# Patient Record
Sex: Male | Born: 1960 | Race: White | Hispanic: No | Smoking: Current every day smoker
Health system: Southern US, Community
[De-identification: ages and names within clinical notes are randomized; demographics above are authoritative.]

## PROBLEM LIST (undated history)

## (undated) DIAGNOSIS — M1990 Unspecified osteoarthritis, unspecified site: Secondary | ICD-10-CM

## (undated) DIAGNOSIS — G8929 Other chronic pain: Secondary | ICD-10-CM

## (undated) DIAGNOSIS — J449 Chronic obstructive pulmonary disease, unspecified: Secondary | ICD-10-CM

## (undated) DIAGNOSIS — B1920 Unspecified viral hepatitis C without hepatic coma: Secondary | ICD-10-CM

## (undated) HISTORY — DX: Chronic obstructive pulmonary disease, unspecified: J44.9

## (undated) HISTORY — DX: Unspecified viral hepatitis C without hepatic coma: B19.20

## (undated) HISTORY — DX: Other chronic pain: G89.29

## (undated) HISTORY — DX: Unspecified osteoarthritis, unspecified site: M19.90

---

## 2010-06-29 ENCOUNTER — Emergency Department (HOSPITAL_COMMUNITY): Payer: No Typology Code available for payment source

## 2010-06-29 ENCOUNTER — Emergency Department (HOSPITAL_COMMUNITY)
Admission: EM | Admit: 2010-06-29 | Discharge: 2010-06-29 | Disposition: A | Discharge status: Home / Self Care | Payer: No Typology Code available for payment source | Attending: Emergency Medicine | Admitting: Emergency Medicine

## 2010-06-29 DIAGNOSIS — S2249XA Multiple fractures of ribs, unspecified side, initial encounter for closed fracture: Secondary | ICD-10-CM | POA: Insufficient documentation

## 2011-03-30 ENCOUNTER — Emergency Department (HOSPITAL_COMMUNITY): Payer: Self-pay

## 2011-03-30 ENCOUNTER — Other Ambulatory Visit: Payer: Self-pay

## 2011-03-30 ENCOUNTER — Emergency Department (HOSPITAL_COMMUNITY)
Admission: EM | Admit: 2011-03-30 | Discharge: 2011-03-31 | Disposition: A | Discharge status: Home / Self Care | Payer: Self-pay | Attending: Emergency Medicine | Admitting: Emergency Medicine

## 2011-03-30 DIAGNOSIS — M549 Dorsalgia, unspecified: Secondary | ICD-10-CM | POA: Insufficient documentation

## 2011-03-30 DIAGNOSIS — F10929 Alcohol use, unspecified with intoxication, unspecified: Secondary | ICD-10-CM

## 2011-03-30 DIAGNOSIS — F17200 Nicotine dependence, unspecified, uncomplicated: Secondary | ICD-10-CM | POA: Insufficient documentation

## 2011-03-30 DIAGNOSIS — F1010 Alcohol abuse, uncomplicated: Secondary | ICD-10-CM | POA: Insufficient documentation

## 2011-03-30 DIAGNOSIS — R079 Chest pain, unspecified: Secondary | ICD-10-CM | POA: Insufficient documentation

## 2011-03-30 DIAGNOSIS — W19XXXA Unspecified fall, initial encounter: Secondary | ICD-10-CM | POA: Insufficient documentation

## 2011-03-30 DIAGNOSIS — R209 Unspecified disturbances of skin sensation: Secondary | ICD-10-CM | POA: Insufficient documentation

## 2011-03-30 LAB — COMPREHENSIVE METABOLIC PANEL
ALT: 65 U/L — ABNORMAL HIGH (ref 0–53)
AST: 90 U/L — ABNORMAL HIGH (ref 0–37)
Albumin: 4 g/dL (ref 3.5–5.2)
Alkaline Phosphatase: 47 U/L (ref 39–117)
GFR calc Af Amer: >90 mL/min (ref >90)
Glucose, Bld: 83 mg/dL (ref 70–99)
Potassium: 5 mEq/L (ref 3.5–5.1)
Sodium: 140 mEq/L (ref 135–145)
Total Protein: 7.7 g/dL (ref 6.0–8.3)

## 2011-03-30 LAB — URINALYSIS, ROUTINE W REFLEX MICROSCOPIC
Nitrite: NEGATIVE
Specific Gravity, Urine: <1.005 — ABNORMAL LOW (ref 1.005–1.030)
Urobilinogen, UA: 0.2 mg/dL (ref 0.0–1.0)

## 2011-03-30 LAB — DIFFERENTIAL
Eosinophils Absolute: 0.2 10*3/uL (ref 0.0–0.7)
Lymphs Abs: 3.9 10*3/uL (ref 0.7–4.0)
Neutrophils Relative %: 29 % — ABNORMAL LOW (ref 43–77)

## 2011-03-30 LAB — PROTIME-INR
INR: 0.95 (ref 0.00–1.49)
Prothrombin Time: 12.9 seconds (ref 11.6–15.2)

## 2011-03-30 LAB — CBC
MCH: 32.9 pg (ref 26.0–34.0)
Platelets: 151 10*3/uL (ref 150–400)
RBC: 4.53 MIL/uL (ref 4.22–5.81)
WBC: 6.3 10*3/uL (ref 4.0–10.5)

## 2011-03-30 MED ORDER — SODIUM CHLORIDE 0.9 % IV SOLN: INTRAVENOUS | Status: DC

## 2011-03-30 NOTE — ED Provider Notes (Signed)
Scribed for Ward Givens, MD, the patient was seen in room APA08/APA08 . This chart was scribed by Ellie Lunch.   CSN: [id number] Arrival date & time: 03/30/2011  9:31 PM   First MD Initiated Contact with Patient 03/30/11 2222      Chief Complaint  Patient presents with  . Chest Pain  . Back Pain    (Consider location/radiation/quality/duration/timing/severity/associated sxs/prior treatment) HPI Pt seen at 10:25 PM  John Wells is a 50 y.o. male who presents to the Emergency Department complaining of chest pain and weakness. Pt was working on a roof in [place] 2 days ago and fell. Denies LOC or head injury. Reports some left chest wall tenderness after fall.  Pain is described as a sharp pain and currently rated 5/10. Pain is worse with deep breath. Pt treated pain with alcohol with little improvement. Pt says he was able to work yesterday. Pt reports numbness in his left shoulder, arm, part of his face, and part of his left leg since yesterday.  Per friend Pt returned from [place] last night. Pt was at baseline yesterday. Pt also fell again tonight ~ 2.5 hours ago. Pt fell on his left side. Friend observed speech disturbance after fall when he picked up PT to bring him to the ED. PT admits to drinking alcohol today.   Primary care physician none  History reviewed. No pertinent past medical history. History of cervical fracture at C6-7, also fracture of his back  History reviewed. No pertinent past surgical history.  No family history on file. No family history of stroke.   History  Substance Use Topics  . Smoking status: Current Everyday Smoker -- 2.0 packs/day  . Smokeless tobacco: Not on file  . Alcohol Use: Yes   patient employed   Review of Systems  Cardiovascular: Positive for chest pain (chest wall tenderness).  Neurological: Positive for numbness. Negative for headaches.  All other systems reviewed and are negative.    Allergies  Review of patient's  allergies indicates no known allergies.  Home Medications  No current outpatient prescriptions on file.  BP 118/84  Pulse 78  Temp(Src) 98.4 F (36.9 C) (Oral)  Resp 18  Ht 6' (1.829 m)  Wt 150 lb (68.04 kg)  BMI 20.34 kg/m2  SpO2 94%  Vital signs normal   Physical Exam  Vitals reviewed. Constitutional: He is oriented to person, place, and time.       Patient is is underweight.. patient has very difficult speech to understand he is also noted to be edentulous. Patient smells of alcohol  HENT:  Head: Normocephalic and atraumatic.  Eyes: Conjunctivae and EOM are normal. Pupils are equal, round, and reactive to light.  Neck: Normal range of motion. Neck supple.  Cardiovascular: Normal rate, regular rhythm and normal heart sounds.   Pulmonary/Chest: Effort normal and breath sounds normal. He has no wheezes. He has no rales. He exhibits no tenderness.  Abdominal: Soft. Bowel sounds are normal.  Musculoskeletal: Normal range of motion.       Patient seems to have a lot of pain centered in his left shoulder he has pain on attempted range of motion. There is no obvious deformity or swelling or bruising seen.  Neurological: He is alert and oriented to person, place, and time. No cranial nerve deficit.       Patient has no cranial nerve deficit. He's noted to have decreased range of motion of his left upper arm and left lower leg that is mainly  because of pain when he tries to move it. He seems to center a lot of his pain in his left shoulder. Although there is no swelling or deformity seen. He is uncoordinated with finger to nose on the right and has difficulty with finger to nose on the left because of pain when he moves his left arm. His grips are good with slight decrease on the left. He has good range of motion of his arm at the elbow he has some added adduction of his shoulder but he states it hurts in his shoulder  Skin: Skin is warm and dry.    ED Course  Procedures (including  critical care time) OTHER DATA REVIEWED: Nursing notes, vital signs, and past medical records reviewed.   DIAGNOSTIC STUDIES: Oxygen Saturation is 94% on room air, adequate by my interpretation.     Results for orders placed during the hospital encounter of 03/30/11  CBC      Component Value Range   WBC 6.3  4.0 - 10.5 (K/uL)   RBC 4.53  4.22 - 5.81 (MIL/uL)   Hemoglobin 14.9  13.0 - 17.0 (g/dL)   HCT 16.1  09.6 - 04.5 (%)   MCV 100.0  78.0 - 100.0 (fL)   MCH 32.9  26.0 - 34.0 (pg)   MCHC 32.9  30.0 - 36.0 (g/dL)   RDW 40.9  81.1 - 91.4 (%)   Platelets 151  150 - 400 (K/uL)  DIFFERENTIAL      Component Value Range   Neutrophils Relative 29 (*) 43 - 77 (%)   Neutro Abs 1.8  1.7 - 7.7 (K/uL)   Lymphocytes Relative 62 (*) 12 - 46 (%)   Lymphs Abs 3.9  0.7 - 4.0 (K/uL)   Monocytes Relative 6  3 - 12 (%)   Monocytes Absolute 0.4  0.1 - 1.0 (K/uL)   Eosinophils Relative 3  0 - 5 (%)   Eosinophils Absolute 0.2  0.0 - 0.7 (K/uL)   Basophils Relative 1  0 - 1 (%)   Basophils Absolute 0.0  0.0 - 0.1 (K/uL)  COMPREHENSIVE METABOLIC PANEL      Component Value Range   Sodium 140  135 - 145 (mEq/L)   Potassium 5.0  3.5 - 5.1 (mEq/L)   Chloride 106  96 - 112 (mEq/L)   CO2 25  19 - 32 (mEq/L)   Glucose, Bld 83  70 - 99 (mg/dL)   BUN 4 (*) 6 - 23 (mg/dL)   Creatinine, Ser 7.82  0.50 - 1.35 (mg/dL)   Calcium 9.0  8.4 - 95.6 (mg/dL)   Total Protein 7.7  6.0 - 8.3 (g/dL)   Albumin 4.0  3.5 - 5.2 (g/dL)   AST 90 (*) 0 - 37 (U/L)   ALT 65 (*) 0 - 53 (U/L)   Alkaline Phosphatase 47  39 - 117 (U/L)   Total Bilirubin 0.3  0.3 - 1.2 (mg/dL)   GFR calc non Af Amer >90  >90 (mL/min)   GFR calc Af Amer >90  >90 (mL/min)  ETHANOL      Component Value Range   Alcohol, Ethyl (B) 318 (*) 0 - 11 (mg/dL)  TROPONIN I      Component Value Range   Troponin I <0.30  <0.30 (ng/mL)  APTT      Component Value Range   aPTT 26  24 - 37 (seconds)  PROTIME-INR      Component Value Range   Prothrombin  Time 12.9  11.6 -  15.2 (seconds)   INR 0.95  0.00 - 1.49   URINALYSIS, ROUTINE W REFLEX MICROSCOPIC      Component Value Range   Color, Urine YELLOW  YELLOW    Appearance CLEAR  CLEAR    Specific Gravity, Urine <1.005 (*) 1.005 - 1.030    pH 5.0  5.0 - 8.0    Glucose, UA NEGATIVE  NEGATIVE (mg/dL)   Hgb urine dipstick NEGATIVE  NEGATIVE    Bilirubin Urine NEGATIVE  NEGATIVE    Ketones, ur NEGATIVE  NEGATIVE (mg/dL)   Protein, ur NEGATIVE  NEGATIVE (mg/dL)   Urobilinogen, UA 0.2  0.0 - 1.0 (mg/dL)   Nitrite NEGATIVE  NEGATIVE    Leukocytes, UA NEGATIVE  NEGATIVE    Laboratory interpretation acute abnormalities except for alcohol intoxication  Dg Chest 2 View  03/30/2011  *RADIOLOGY REPORT*  Clinical Data: Left-sided chest pain, back pain.  Recent fall.  CHEST - 2 VIEW  Comparison: 06/29/2010  Findings: Hyperinflation, in keeping with changes of COPD. Cardiomediastinal contours within normal limits.  Multiple subacute and chronic posterior and posterolateral left rib fractures  IMPRESSION: Hyperinflation without focal consolidation.  Subacute and chronic appearing posterior and posterolateral left rib fractures.  Original Report Authenticated By: Waneta Martins, M.D.   Ct Head Wo Contrast  03/30/2011  *RADIOLOGY REPORT*  Clinical Data: Altered mental status, left-sided weakness, recent fall.  CT HEAD WITHOUT CONTRAST  Technique:  Contiguous axial images were obtained from the base of the skull through the vertex without contrast.  Comparison: None.  Findings: Hypodensity within the right cerebellum (image 11 of series 2). There is no evidence for acute hemorrhage, hydrocephalus, mass lesion, or abnormal extra-axial fluid collection. Otherwise, no CT evidence for acute infarction.  The visualized paranasal sinuses and mastoid air cells are predominately clear.  No displaced calvarial fracture identified. Curvilinear calcific density anterior to the nasal bones, may reflect remote trauma.   IMPRESSION: Hypodensity within the right cerebellum may reflect a prominent sulcus however an age indeterminate lacunar infarction is not excluded. If clinical concern for acute ischemia persists, recommend MRI with diffusion weighted imaging.  Curvilinear calcific density anterior to the nasal bones, may reflect remote trauma.  Correlate with direct inspection if concerned for acute injury.  Original Report Authenticated By: Waneta Martins, M.D.    Date: 03/30/2011  Rate: 80  Rhythm: normal sinus rhythm  QRS Axis: normal  Intervals: normal  ST/T Wave abnormalities: normal  Conduction Disutrbances:none  Narrative Interpretation: IBBB, Biatrial enlargment  Old EKG Reviewed: none available  Course patient had IV inserted he was given IV fluids. His plan will be to observe until he sobers up and then repeat his neurological exam. It is not clear at this time whether his symptoms are related to his fall with contusions or she has an acute neurological deficit. His friend states he was with him at 4:30 and patient seemed fine however he been drinking afterwards and went to a friend's house and tripped on a root in the yard and fell onto his left side again. Because of history of fall patient has CT of chest and abdomen ordered to make sure he does not have an occult injury there from his fall.  Diagnoses that have been ruled out:  Diagnoses that are still under consideration:  Final diagnoses:  Fall  Alcohol intoxication    Plan is to observe patient in the ER until he sobers up and reexamine patient MDM    I personally performed the  services described in this documentation, which was scribed in my presence. The recorded information has been reviewed and considered. Devoria Albe, MD, Arm[facility] Gang          Ward Givens, MD 03/31/11 [id number]

## 2011-03-30 NOTE — ED Notes (Signed)
Pt presents with left sided chest pain, left arm numbness, and back pain. Pt states he fell off a roof 2 days ago.

## 2011-03-30 NOTE — ED Notes (Signed)
Pt reports he fell 2 days ago off of a roof, pt reports he fell onto the grass on his left side, today pt reports he began to have some chest pain and generalized weakness, pt reports he fell again tonight landing again on left side, grip strength weaker on left, Dr. Lynelle Doctor notified

## 2011-03-30 NOTE — ED Notes (Signed)
Pt speech is slurred and pt is being difficult in regards to answering questions. Pt admits to drinking " a couple of forties".

## 2011-03-31 ENCOUNTER — Emergency Department (HOSPITAL_COMMUNITY): Payer: Self-pay

## 2011-03-31 MED ORDER — IOHEXOL 300 MG/ML  SOLN
100.0000 mL | Freq: Once | INTRAMUSCULAR | Status: AC | PRN
  Administered 2011-03-31: 100 mL via INTRAVENOUS

## 2011-03-31 NOTE — Progress Notes (Signed)
0100 Assumed care/disposition of patient. Patient fell of of a roof several days ago and c/o left sided pain. He has been drinking heavily and ETOH is 318. Awaiting CT scan results. Patient to remain in the ER overnight. Currently sleeping. 0300 CTs negative for acute injury. Patient remains stable. Awakens easily. VSS. No c/o Dg Chest 2 View  03/30/2011  *RADIOLOGY REPORT*  Clinical Data: Left-sided chest pain, back pain.  Recent fall.  CHEST - 2 VIEW  Comparison: 06/29/2010  Findings: Hyperinflation, in keeping with changes of COPD. Cardiomediastinal contours within normal limits.  Multiple subacute and chronic posterior and posterolateral left rib fractures  IMPRESSION: Hyperinflation without focal consolidation.  Subacute and chronic appearing posterior and posterolateral left rib fractures.  Original Report Authenticated By: Waneta Martins, M.D.   Ct Head Wo Contrast  03/30/2011  *RADIOLOGY REPORT*  Clinical Data: Altered mental status, left-sided weakness, recent fall.  CT HEAD WITHOUT CONTRAST  Technique:  Contiguous axial images were obtained from the base of the skull through the vertex without contrast.  Comparison: None.  Findings: Hypodensity within the right cerebellum (image 11 of series 2). There is no evidence for acute hemorrhage, hydrocephalus, mass lesion, or abnormal extra-axial fluid collection. Otherwise, no CT evidence for acute infarction.  The visualized paranasal sinuses and mastoid air cells are predominately clear.  No displaced calvarial fracture identified. Curvilinear calcific density anterior to the nasal bones, may reflect remote trauma.  IMPRESSION: Hypodensity within the right cerebellum may reflect a prominent sulcus however an age indeterminate lacunar infarction is not excluded. If clinical concern for acute ischemia persists, recommend MRI with diffusion weighted imaging.  Curvilinear calcific density anterior to the nasal bones, may reflect remote trauma.   Correlate with direct inspection if concerned for acute injury.  Original Report Authenticated By: Waneta Martins, M.D.   Ct Chest W Contrast  03/31/2011  *RADIOLOGY REPORT*  Clinical Data:  Fall off roof.  Left-sided chest, abdominal, and flank pain.  CT CHEST, ABDOMEN AND PELVIS WITH CONTRAST  Technique:  Multidetector CT imaging of the chest, abdomen and pelvis was performed following the standard protocol during bolus administration of intravenous contrast.  Contrast: OMNIPAQUE IOHEXOL 300 MG/ML IV SOLN  Comparison:  None  CT CHEST  Findings:  No evidence of thoracic aortic injury or mediastinal hematoma.  No mediastinal or hilar masses are identified.  No adenopathy seen elsewhere within the thorax.  There is no evidence of hemothorax or pneumothorax.  Both lungs are clear.  No evidence of mass or infiltrate.  No fractures are identified.  IMPRESSION:  Negative.  No evidence of thoracic aortic injury or other significant abnormality.  CT ABDOMEN AND PELVIS  Findings:  No evidence of lacerations or contusions involving the abdominal parenchymal organs.  No evidence of hemoperitoneum.  No soft tissue masses or lymphadenopathy identified.  No evidence of inflammatory process or abnormal fluid collections.  Unopacified bowel has a normal appearance.  No evidence of acute fracture.  Old compression fracture of the L2 vertebral body is noted.  IMPRESSION:  1.  No evidence of visceral injury, or hemoperitoneum, or other acute findings. 2.  Old L2 vertebral body compression fracture.  Original Report Authenticated By: Danae Orleans, M.D.   Ct Cervical Spine Wo Contrast  03/31/2011  *RADIOLOGY REPORT*  Clinical Data: Left-sided neck stiffness  CT CERVICAL SPINE WITHOUT CONTRAST  Technique:  Multidetector CT imaging of the cervical spine was performed. Multiplanar CT image reconstructions were also generated.  Comparison: 03/30/2011  head CT, 03/31/2011 chest CT  Findings: Limited images through the  lower brain show no acute abnormality.  Limited images through the lung apices demonstrate centrolobular emphysematous changes. No prevertebral or paravertebral soft tissue swelling.  The craniocervical relationship is maintained.  Multilevel degenerative changes with facet arthropathy present.  Degenerative disc disease is most pronounced at C6-7.  Disc osteophyte complex and uncovertebral hypertrophy at this level results in mild central canal narrowing and moderate right neural foraminal narrowing.  IMPRESSION: Multilevel degenerative changes.  No acute fracture or dislocation identified.  Original Report Authenticated By: Waneta Martins, M.D.   Ct Abdomen Pelvis W Contrast  03/31/2011  *RADIOLOGY REPORT*  Clinical Data:  Fall off roof.  Left-sided chest, abdominal, and flank pain.  CT CHEST, ABDOMEN AND PELVIS WITH CONTRAST  Technique:  Multidetector CT imaging of the chest, abdomen and pelvis was performed following the standard protocol during bolus administration of intravenous contrast.  Contrast: OMNIPAQUE IOHEXOL 300 MG/ML IV SOLN  Comparison:  None  CT CHEST  Findings:  No evidence of thoracic aortic injury or mediastinal hematoma.  No mediastinal or hilar masses are identified.  No adenopathy seen elsewhere within the thorax.  There is no evidence of hemothorax or pneumothorax.  Both lungs are clear.  No evidence of mass or infiltrate.  No fractures are identified.  IMPRESSION:  Negative.  No evidence of thoracic aortic injury or other significant abnormality.  CT ABDOMEN AND PELVIS  Findings:  No evidence of lacerations or contusions involving the abdominal parenchymal organs.  No evidence of hemoperitoneum.  No soft tissue masses or lymphadenopathy identified.  No evidence of inflammatory process or abnormal fluid collections.  Unopacified bowel has a normal appearance.  No evidence of acute fracture.  Old compression fracture of the L2 vertebral body is noted.  IMPRESSION:  1.  No  evidence of visceral injury, or hemoperitoneum, or other acute findings. 2.  Old L2 vertebral body compression fracture.  Original Report Authenticated By: Danae Orleans, M.D.   Dg Shoulder Left  03/31/2011  *RADIOLOGY REPORT*  Clinical Data: Left shoulder pain status post fall.  LEFT SHOULDER - 2+ VIEW  Comparison: None.  Findings: No fracture or dislocation of the left shoulder. Evidence of prior posterior left rib fractures.  IMPRESSION: No fracture or dislocation of the left shoulder.  Original Report Authenticated By: Waneta Martins, M.D.  [phone] Patient awake, oriented, asking to be discharge. Up to the bathroom without assistance. No voiced c/o. Nursing to call ride.

## 2011-03-31 NOTE — ED Notes (Signed)
Pt resting quietly.

## 2011-03-31 NOTE — ED Notes (Signed)
Pt's family member enroute to pick up pt

## 2012-05-31 DIAGNOSIS — R0602 Shortness of breath: Secondary | ICD-10-CM

## 2014-05-14 HISTORY — PX: OTHER SURGICAL HISTORY: SHX169
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
## 2016-08-22 NOTE — Congregational Nurse Program (Unsigned)
Congregational Nurse Program Note  Date of Encounter: 08/22/2016   Past Medical History: No past medical history on file.  Encounter Details: complained of pain  In  t leg
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
## 2016-11-12 NOTE — Congregational Nurse Program (Signed)
Congregational Nurse Program Note  Date of Encounter: 11/12/2016  Past Medical History: No past medical history on file.  Encounter Details:     CNP Questionnaire - 11/12/16 1049      Patient Demographics   Is this a new or existing patient? New   Patient is considered a/an Not Applicable   Race Caucasian/White     Patient Assistance   Location of Patient [place]   Patient's financial/insurance status Self-Pay (Uninsured)   Uninsured Patient ([place] Card/Care Connects) No   Patient referred to apply for the following financial assistance [employer] insecurities addressed Provided food supplies   Transportation assistance No   Assistance securing medications No   Doctor, hospital the healthcare system;Chronic disease     Encounter Details   Primary purpose of visit [place]   Was an Emergency Department visit averted? No   Does patient have a medical provider? No   Patient referred to Clinic   Was a mental health screening completed? (GAINS tool) No   Does patient have dental issues? No   Does patient have vision issues? No   Does your patient have an abnormal blood pressure today? No   Since previous encounter, have you referred patient for abnormal blood pressure that resulted in a new diagnosis or medication change? No   Does your patient have an abnormal blood glucose today? No   Since previous encounter, have you referred patient for abnormal blood glucose that resulted in a new diagnosis or medication change? No   Was there a life-saving intervention made? No    Cient was assisted with applying for CareConnects through [employer] to establish a  PCP at [facility] in [place]. An appointment is scheduled for 12/03/16.  Tarri Fuller  [phone].

## 2016-12-18 ENCOUNTER — Encounter: Payer: Self-pay | Admitting: Gastroenterology

## 2017-02-11 ENCOUNTER — Encounter: Payer: Self-pay | Admitting: Gastroenterology

## 2017-02-11 ENCOUNTER — Ambulatory Visit (INDEPENDENT_AMBULATORY_CARE_PROVIDER_SITE_OTHER): Payer: Self-pay | Admitting: Gastroenterology

## 2017-02-11 DIAGNOSIS — B182 Chronic viral hepatitis C: Secondary | ICD-10-CM
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
## 2017-02-11 NOTE — Patient Instructions (Signed)
1. Please have your labs and ultrasound done as scheduled.  2. You need to cut back on alcohol consumption so we can move towards hepatitis C treatment in the near future. You are also high risk for cirrhosis with chronic alcohol use AND hepatitis C.

## 2017-02-11 NOTE — Assessment & Plan Note (Signed)
56 year old gentleman history of chronic hepatitis C. Patient denies eating told of hepatitis C status during multiple incarcerations, has been out of jail since 2015. Risk factors including remote IV drug use. Did not discuss tattoo status. At any rate confirmed hep C. Obtain labs for fibrosis, ultrasound with elastography. Discussed at length with patient, he needs to cut way back on his alcohol use prior to considering hepatitis C treatment. He voiced understanding. It is important to get his hep C treated to support better outcome with regards to liver function. Discussed increased risk of cirrhosis in the setting of concomitant hep C and alcohol use.  Patient is homeless. Lives in a tent at times but currently living in a friend's house. He does have means for food and is able to keep the cell phone on. Discussed importance of being able to maintain contact with the patient especially if we proceed with hep C treatment. He voiced understanding.

## 2017-02-14 NOTE — Progress Notes (Signed)
CC'ED TO PCP 

## 2017-02-15 ENCOUNTER — Other Ambulatory Visit (HOSPITAL_COMMUNITY)
Admission: RE | Admit: 2017-02-15 | Discharge: 2017-02-15 | Disposition: A | Discharge status: Home / Self Care | Payer: Medicaid Other | Source: Ambulatory Visit | Attending: Gastroenterology | Admitting: Gastroenterology

## 2017-02-15 ENCOUNTER — Ambulatory Visit (HOSPITAL_COMMUNITY)
Admission: RE | Admit: 2017-02-15 | Discharge: 2017-02-15 | Disposition: A | Discharge status: Home / Self Care | Payer: Medicaid Other | Source: Ambulatory Visit | Attending: Gastroenterology | Admitting: Gastroenterology

## 2017-02-15 DIAGNOSIS — B182 Chronic viral hepatitis C: Secondary | ICD-10-CM | POA: Insufficient documentation

## 2017-02-15 LAB — PROTIME-INR
INR: 0.96
PROTHROMBIN TIME: 12.7 s (ref 11.4–15.2)

## 2017-02-16 LAB — HCV RNA QUANT RFLX ULTRA OR GENOTYP
HCV RNA Qnt(log copy/mL): 1.301 log10 IU/mL
HEPATITIS C QUANTITATION: 20 [IU]/mL

## 2017-02-16 LAB — HIV ANTIBODY (ROUTINE TESTING W REFLEX): HIV Screen 4th Generation wRfx: NONREACTIVE

## 2017-02-16 LAB — HEPATITIS B CORE ANTIBODY, TOTAL: HEP B C TOTAL AB: NEGATIVE

## 2017-02-17 NOTE — Progress Notes (Signed)
HCV RNA count is very low but detectable, only 20 IU/mL. Too low to get a genotype. No HIV or Hep B.  Await u/s and fibrosure test.

## 2017-02-17 NOTE — Progress Notes (Signed)
U/s showed F2 with some F3, moderate risk of fibrosis.  Await fibrosure blood test results for comparison.

## 2017-02-18 NOTE — Progress Notes (Signed)
Letter mailed to call for [place] results and lab results.

## 2017-02-18 NOTE — Progress Notes (Signed)
Letter mailed to call for results on [place] and labs.

## 2017-02-18 NOTE — Progress Notes (Signed)
Tried to call and VM not set up.  

## 2017-02-19 LAB — MISC LABCORP TEST (SEND OUT): Labcorp test code: 550123

## 2017-02-21 NOTE — Progress Notes (Signed)
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 I have put call in to discuss whether we should move forward with HCV treatment given low viral count. Waiting to hear back from Roosevelt Locks, NP with [facility] liver care.

## 2017-02-28 ENCOUNTER — Telehealth: Payer: Self-pay | Admitting: Gastroenterology

## 2017-02-28 NOTE — Telephone Encounter (Signed)
PT is aware of the lab and [place] results and aware we will call again when we get the remainder result on the fibrosure.

## 2017-02-28 NOTE — Telephone Encounter (Signed)
Pt received letter from DS that she was unable to reach by phone regarding his results. Please call him back at [phone]

## 2017-03-04 NOTE — Progress Notes (Signed)
Please let patient know that I talked to hepatitis C clinic and they suggested further labs to rule out other causes of abnormal LFTs.  He has a very low hepatitis C count and his body may take care of it on its own without medication.  Therefore we will recheck his numbers in 6 months, if still elevated at that time we will go ahead and treat the hepatitis C.  Labs needed now: Iron/TIBC, ferritin.  ANA.  Anti-smooth muscle antibody, AMA, IgG/IgA/IgM.  Diagnosis  abnormal LFTs. Labs needed in 6 months: HCVRNA quantitative with reflex to genotype, LFTs.   Cut back on alcohol use with goal of stopping.   Consider screening colonoscopy whenever he is ready if he has not had one since age 44.  Office visit in 6 months.

## 2017-03-11 ENCOUNTER — Other Ambulatory Visit: Payer: Self-pay | Admitting: General Practice

## 2017-03-11 DIAGNOSIS — R945 Abnormal results of liver function studies: Secondary | ICD-10-CM

## 2017-03-11 DIAGNOSIS — B182 Chronic viral hepatitis C: Secondary | ICD-10-CM

## 2017-03-11 DIAGNOSIS — R7989 Other specified abnormal findings of blood chemistry: Secondary | ICD-10-CM

## 2017-03-11 NOTE — Progress Notes (Signed)
Patient made aware of labs/recommendations, he stated he will repeat his labs tomorrow.  He will let us know when he would like to have a screening tcs.  He was told he has a f/u appt 09/09/17 at 11:30 am  I placed 6 month repeat labs in the recall folder.

## 2017-03-17 NOTE — Progress Notes (Signed)
Unfortunately the lab did not do the smooth muscle ab/AMA.  See if it can be added on.  His ferritin is also high so he needs hemochromatosis genetic testing labs.

## 2017-03-18 ENCOUNTER — Other Ambulatory Visit: Payer: Self-pay

## 2017-03-18 DIAGNOSIS — K739 Chronic hepatitis, unspecified: Secondary | ICD-10-CM

## 2017-03-18 NOTE — Progress Notes (Signed)
Yes, he willl need to go back to labs for the hemochromatosis labs.

## 2017-03-21 LAB — IGG, IGA, IGM
IGM, SERUM: 81 mg/dL (ref 48–271)
IMMUNOGLOBULIN A: 189 mg/dL (ref 81–463)
IgG (Immunoglobin G), Serum: 1295 mg/dL (ref 694–1618)

## 2017-03-21 LAB — TEST AUTHORIZATION

## 2017-03-21 LAB — IRON,TIBC AND FERRITIN PANEL
%SAT: 51 % (ref 15–60)
FERRITIN: 422 ng/mL — AB (ref 20–380)
IRON: 153 ug/dL (ref 50–180)
TIBC: 299 mcg/dL (calc) (ref 250–425)

## 2017-03-21 LAB — ANA: Anti Nuclear Antibody(ANA): NEGATIVE

## 2017-03-21 LAB — MITOCHONDRIAL AB RFLX TITR: MITOCHONDRIAL ANTIBODY SCREEN: NEGATIVE

## 2017-03-21 LAB — ANTI-SMOOTH MUSCLE ANTIBODY, IGG

## 2017-03-28 LAB — HEMOCHROMATOSIS DNA-PCR(C282Y,H63D)

## 2017-04-02 ENCOUNTER — Telehealth: Payer: Self-pay | Admitting: Gastroenterology

## 2017-04-02 NOTE — Telephone Encounter (Signed)
I'm still waiting for him to go back for the additional labs that we called him about couple of weeks ago. (hemochromatosis dna, anti-smooth muscle and mitochronidial ab)   His HCV was so low that we could not genotype. He was told on 10/29 by Rosendo Gros (see result note) that we will recheck it in six months to give his body time to try and get rid of the virus on its own and if not then we will treat at that time. He needs to stop etoh.

## 2017-04-02 NOTE — Telephone Encounter (Signed)
PT is aware and said he went back and did the extra labs on 03/22/2017.

## 2017-04-02 NOTE — Telephone Encounter (Signed)
[phone] PLEASE CALL PATIENT, HE WAS TOLD BY [facility] THAT HE NEEDED TO START HEP MEDICATION.  HE WAS SEEN HERE LAST MONTH.  WAS THIS DISCUSSED AT HIS OV? PLEASE ADVISE
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
## 2017-04-03 ENCOUNTER — Other Ambulatory Visit: Payer: Self-pay

## 2017-04-03 DIAGNOSIS — B182 Chronic viral hepatitis C: Secondary | ICD-10-CM

## 2017-04-03 NOTE — Telephone Encounter (Signed)
Noted. Please let him know the Hemochromatosis labs were negative. His ferritin is likely up due to etoh use.  All of his other liver labs were negative for autoimmune hepatitis, PBC.   Plan as outline, stop etoh.  Let's recheck his LFTs, ferritin in 2 months.  Keep appt in 08/2016 to follow up on HCV.

## 2017-04-03 NOTE — Telephone Encounter (Signed)
Pt is aware and lab orders on file for 05/2017.

## 2017-04-08 ENCOUNTER — Telehealth: Payer: Self-pay
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
## 2017-04-18 ENCOUNTER — Other Ambulatory Visit: Payer: Self-pay

## 2017-04-18 DIAGNOSIS — B182 Chronic viral hepatitis C: Secondary | ICD-10-CM

## 2017-07-29 ENCOUNTER — Other Ambulatory Visit: Payer: Self-pay

## 2017-07-29 DIAGNOSIS — B182 Chronic viral hepatitis C: Secondary | ICD-10-CM

## 2017-07-29 DIAGNOSIS — R7989 Other specified abnormal findings of blood chemistry: Secondary | ICD-10-CM

## 2017-07-29 DIAGNOSIS — R945 Abnormal results of liver function studies: Secondary | ICD-10-CM

## 2017-08-11 DIAGNOSIS — S129XXA Fracture of neck, unspecified, initial encounter: Secondary | ICD-10-CM | POA: Insufficient documentation

## 2017-09-09 ENCOUNTER — Encounter: Payer: Self-pay | Admitting: Gastroenterology

## 2017-09-09 ENCOUNTER — Ambulatory Visit: Payer: Medicaid Other | Admitting: Gastroenterology

## 2017-09-09 VITALS — BP 148/91 | HR 88 | Temp 99.0°F | Ht 72.0 in | Wt 123.6 lb

## 2017-09-09 DIAGNOSIS — K769 Liver disease, unspecified: Secondary | ICD-10-CM

## 2017-09-09 DIAGNOSIS — B182 Chronic viral hepatitis C: Secondary | ICD-10-CM
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
## 2017-09-09 NOTE — Progress Notes (Signed)
CC'D TO PCP °

## 2017-09-09 NOTE — Patient Instructions (Signed)
1. Please have your labs done at your convenience.  2. You will need an MRI as soon as you are safe to come out of your brace for the study. Keep me posted.

## 2017-09-09 NOTE — Assessment & Plan Note (Addendum)
57 year old gentleman with history of chronic hepatitis C, HCVRNA was extremely low at last check, could not even obtain a genotype.  Had previously discussed with [facility] liver care who recommended repeating HCVRNA at 33-month interval, hopefully patient with clear virus on his own.  He presents today for that purpose.  Incidentally he has been found to have a small 1 cm liver lesion on recent CT imaging at [facility].?  Hemangioma.  Recommend MRI imaging to further evaluate once patient is stable enough to come out of his brace.  Patient understands plan.  He has an appointment with his surgeon on Friday and will let me know any updates.   We will go ahead and have labs done the next week to follow-up on HCVRNA as well as elevated ferritin.
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
## 2017-09-16 ENCOUNTER — Ambulatory Visit: Payer: Medicaid Other | Admitting: Family Medicine

## 2017-09-16 ENCOUNTER — Encounter: Payer: Self-pay | Admitting: Family Medicine

## 2017-09-16 VITALS — BP 144/90 | HR 94 | Temp 98.5°F | Ht 72.0 in | Wt 120.0 lb

## 2017-09-16 DIAGNOSIS — Z7689 Persons encountering health services in other specified circumstances: Secondary | ICD-10-CM | POA: Diagnosis not present

## 2017-09-16 DIAGNOSIS — F17210 Nicotine dependence, cigarettes, uncomplicated: Secondary | ICD-10-CM

## 2017-09-16 DIAGNOSIS — B182 Chronic viral hepatitis C: Secondary | ICD-10-CM | POA: Diagnosis not present

## 2017-09-16 DIAGNOSIS — G479 Sleep disorder, unspecified: Secondary | ICD-10-CM | POA: Insufficient documentation

## 2017-09-16 DIAGNOSIS — R030 Elevated blood-pressure reading, without diagnosis of hypertension: Secondary | ICD-10-CM

## 2017-09-16 NOTE — Patient Instructions (Addendum)
Follow up with your Gastroenterologist as scheduled and the Trauma/ Spine doctors as scheduled.  Have them send me your records with each visit.  Use Melantonin up to 6 mg daily for sleep. We discussed that your sleep difficulty is likely from you having come off of the alcohol.  This is very typical.    Alcohol Withdrawal Alcohol withdrawal is a group of symptoms that can develop when a person who drinks heavily and regularly stops drinking or drinks less. What are the causes? Heavy and regular drinking can cause chemicals that send signals from the brain to the body (neurotransmitters) to deactivate. Alcohol withdrawal develops when deactivated neurotransmitters reactivate because a person stops drinking or drinks less. What increases the risk? The more a person drinks and the longer he or she drinks, the greater the risk of alcohol withdrawal. Severe withdrawal is more likely to develop in someone who:  Had severe alcohol withdrawal in the past.  Had a seizure during a previous episode of alcohol withdrawal.  Is elderly.  Is pregnant.  Has been abusing drugs.  Has other medical problems, including: ? Infection. ? Heart, lung, or liver disease. ? Seizures. ? Mental health problems.  What are the signs or symptoms? Symptoms of this condition can be mild to moderate, or they can be severe. Mild to moderate symptoms may include:  Fatigue.  Nightmares.  Trouble sleeping.  Depression.  Anxiety.  Inability to think clearly.  Mood swings.  Irritability.  Loss of appetite.  Nausea or vomiting.  Clammy skin.  Extreme sweating.  Rapid heartbeat.  Shakiness.  Uncontrollable shaking (tremor).  Severe symptoms may include:  Fever.  Seizures.  Severeconfusion.  Feeling or seeing things that are not there (hallucinations).  Symptoms usually begin within eight hours after a person stops drinking or drinks less. They can last for weeks. How is this  diagnosed? Alcohol withdrawal is diagnosed with a medical history and physical exam. Sometimes, urine and blood tests are also done. How is this treated? Treatment may involve:  Monitoring blood pressure, pulse, and breathing.  Getting fluids through an IV tube.  Medicine to reduce anxiety.  Medicine to prevent or control seizures.  Multivitamins and B vitamins.  Having a health care provider check on you daily.  If symptoms are moderate to severe or if there is a risk of severe withdrawal, treatment may be done at a hospital or treatment center. Follow these instructions at home:  Take medicines and vitamin supplements only as directed by your health care provider.  Do not drink alcohol.  Have someone stay with you or be available if you need help.  Drink enough fluid to keep your urine clear or pale yellow.  Consider joining a 12-step program or another alcohol support group. Contact a health care provider if:  Your symptoms get worse or do not go away.  You cannot keep food or water in your stomach.  You are struggling with not drinking alcohol.  You cannot stop drinking alcohol. Get help right away if:  You have an irregular heartbeat.  You have chest pain.  You have trouble breathing.  You have symptoms of severe withdrawal, such as: ? A fever. ? Seizures. ? Severe confusion. ? Hallucinations. This information is not intended to replace advice given to you by your health care provider. Make sure you discuss any questions you have with your health care provider. Document Released: 02/07/2005 Document Revised: 09/07/2015 Document Reviewed: 02/16/2014 Elsevier Interactive Patient Education  [employer].

## 2017-09-17 ENCOUNTER — Encounter: Payer: Self-pay | Admitting: Gastroenterology

## 2017-09-19 ENCOUNTER — Telehealth: Payer: Self-pay

## 2017-09-19 NOTE — Telephone Encounter (Signed)
Received lab results from [facility] via fax. HCV RNA not detected. ( ordering physician on lab was Maurine Cane. Results placed in box for review by Neil Crouch, PA.

## 2017-09-30 ENCOUNTER — Telehealth: Payer: Self-pay | Admitting: Gastroenterology

## 2017-09-30 ENCOUNTER — Other Ambulatory Visit: Payer: Self-pay

## 2017-09-30 DIAGNOSIS — B182 Chronic viral hepatitis C: Secondary | ICD-10-CM

## 2017-09-30 NOTE — Telephone Encounter (Addendum)
I have read the orthopedic doctor note (Dr. Brigitte Pulse) dated 09/13/17. "Patient to maintain CTO at all times. Patient advised not to remove to shower or sleep, as a repeat injury could be catastrophic, result in paralysis." Plans for f/u in six weeks and possibly will come out of the brace then.   He will have to wait until his doctor takes him out of his brace before I will order an MRI. He is not supposed to be coming out of the brace for sleeping or showering either.    His labs showed HCV not detected!   1# Need to recheck HCV RNA quantitative level and FERRITIN in 6 months 2# Please tell patient to call us when he is officially taken out of his brace and then we will schedule his MRI.

## 2017-09-30 NOTE — Telephone Encounter (Signed)
Pt said he does not go back to the spine clinic for 6 weeks. I told him Magda Paganini had said in her office visit note that when he could come out of the brace he would need MRI. He said he takes it off to sleep and he could take it off for the MRI if necessary.  He said he is really not supposed to take if off but he cannot take his shower and different things with it on. Magda Paganini, please advise!

## 2017-09-30 NOTE — Telephone Encounter (Signed)
See other telephone note.  
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
## 2017-09-30 NOTE — Telephone Encounter (Signed)
Pt called to say that we had ordered labs on him and he had it done at the [facility] and didn't know if [facility] had seen them yet. He also said that he is having to wearing a neck brace with metal in it and he was supposed to be scheduling an MRI and didn't know if he could have that done with the neck brace on. Please advise and call him at [phone]

## 2017-10-01 ENCOUNTER — Emergency Department (HOSPITAL_COMMUNITY): Payer: Medicaid Other

## 2017-10-01 ENCOUNTER — Other Ambulatory Visit: Payer: Self-pay

## 2017-10-01 ENCOUNTER — Emergency Department (HOSPITAL_COMMUNITY)
Admission: EM | Admit: 2017-10-01 | Discharge: 2017-10-01 | Disposition: A | Discharge status: Home / Self Care | Payer: Medicaid Other | Attending: Emergency Medicine | Admitting: Emergency Medicine

## 2017-10-01 ENCOUNTER — Encounter (HOSPITAL_COMMUNITY): Payer: Self-pay | Admitting: Emergency Medicine

## 2017-10-01 DIAGNOSIS — F17210 Nicotine dependence, cigarettes, uncomplicated: Secondary | ICD-10-CM | POA: Insufficient documentation

## 2017-10-01 DIAGNOSIS — S12300A Unspecified displaced fracture of fourth cervical vertebra, initial encounter for closed fracture: Secondary | ICD-10-CM | POA: Insufficient documentation

## 2017-10-01 DIAGNOSIS — Y929 Unspecified place or not applicable: Secondary | ICD-10-CM | POA: Insufficient documentation

## 2017-10-01 DIAGNOSIS — J449 Chronic obstructive pulmonary disease, unspecified: Secondary | ICD-10-CM | POA: Insufficient documentation

## 2017-10-01 DIAGNOSIS — S129XXD Fracture of neck, unspecified, subsequent encounter: Secondary | ICD-10-CM

## 2017-10-01 DIAGNOSIS — Y939 Activity, unspecified: Secondary | ICD-10-CM | POA: Insufficient documentation

## 2017-10-01 DIAGNOSIS — Y999 Unspecified external cause status: Secondary | ICD-10-CM | POA: Diagnosis not present

## 2017-10-01 DIAGNOSIS — S199XXA Unspecified injury of neck, initial encounter: Secondary | ICD-10-CM | POA: Diagnosis present
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
## 2017-10-01 NOTE — Discharge Instructions (Addendum)
Please keep your cervical collar in place at all times. Recheck with your neurosurgeon as soon as possible Return if you have any new weakness or numbness.
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
## 2017-10-01 NOTE — Telephone Encounter (Signed)
Agree with advice provided.  °
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
## 2018-02-17 ENCOUNTER — Other Ambulatory Visit: Payer: Self-pay | Admitting: *Deleted

## 2018-02-17 ENCOUNTER — Encounter: Payer: Self-pay | Admitting: *Deleted

## 2018-02-17 DIAGNOSIS — B182 Chronic viral hepatitis C: Secondary | ICD-10-CM

## 2018-07-05 ENCOUNTER — Telehealth: Payer: Self-pay | Admitting: Gastroenterology

## 2018-07-05 NOTE — Telephone Encounter (Signed)
Patient needs OV. He never followed up with labs and if he is now out of his brace we need to be thinking about MRI to follow up liver lesion.

## 2018-07-07 NOTE — Telephone Encounter (Signed)
Called pt and VM not set up. Will mail a letter for pt to call to make an appt.

## 2018-09-16 ENCOUNTER — Telehealth: Payer: Self-pay | Admitting: *Deleted

## 2018-09-16 ENCOUNTER — Ambulatory Visit (INDEPENDENT_AMBULATORY_CARE_PROVIDER_SITE_OTHER): Payer: Medicaid Other | Admitting: Gastroenterology

## 2018-09-16 ENCOUNTER — Encounter: Payer: Self-pay | Admitting: Gastroenterology

## 2018-09-16 ENCOUNTER — Other Ambulatory Visit: Payer: Self-pay

## 2018-09-16 DIAGNOSIS — B182 Chronic viral hepatitis C: Secondary | ICD-10-CM

## 2018-09-16 DIAGNOSIS — K769 Liver disease, unspecified: Secondary | ICD-10-CM

## 2018-09-16 NOTE — Patient Instructions (Signed)
1. We will be in touch to schedule further imaging of the liver lesion seen last year on your CT scan.  We have to touch base with radiology to make sure that you are eligible for an MRI. 2. I have requested copies of lab work done recently by your family doctor.  We will let you know if additional labs are needed. 3. Return to the office in 3 months.  At that time we will schedule you for your screening colonoscopy.

## 2018-09-16 NOTE — Telephone Encounter (Signed)
Called MRI dept and spoke with sharon. Advised her patient has rods in lower ext (legs) and she stated patient can still have MRI liver done.
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
## 2018-09-16 NOTE — Telephone Encounter (Signed)
MRI scheduled for 6/4 at 8:00am, arrival time 7:30am, npo 4 hrs prior. Called patient. NA and unable to leave VM. I have mailed a letter.

## 2018-09-17 ENCOUNTER — Encounter: Payer: Self-pay | Admitting: Gastroenterology

## 2018-09-17 NOTE — Telephone Encounter (Signed)
Noted  

## 2018-09-17 NOTE — Progress Notes (Signed)
cc'ed to pcp °

## 2018-09-23 ENCOUNTER — Telehealth: Payer: Self-pay | Admitting: *Deleted

## 2018-09-23 NOTE — Telephone Encounter (Signed)
Submitted PA for MRI abd w w/o. This was denied. This was the following reason:  "Based on [employer] Abdomen Imaging Guidelines Section: AB 29.1 Liver Lesion Characterization, we cannot approve this request. Your records show that you have or may have unspecified liver disease. The reason this request cannot be approved is because: 1. Guidelines support a recent ultrasound for the clinical indication(s) presented. Ultrasound may help confirm the diagnosis or may help determine the most appropriate next imaging test. The requested procedure might be indicated when recent ultrasounds have been performed that are technically limited or non-diagnostic. The clinical information provided does not describe these results 2. There is insufficient clinical information to approve the requested study. This information should contain a current clinical evaluation that includes results of a relevant detailed physical examination. The requested imaging is not supported without these results 3. There is insufficient clinical information to approve the requested study. This information should contain a current clinical evaluation that includes results of a relevant detailed history. The requested imaging is not supported without these results 4. There is insufficient clinical information to approve the requested study. This information should contain a current clinical evaluation that includes results of laboratory studies (blood, urine, and/or stool). The requested imaging is not supported without these results and, therefore, the request is not indicated at this time."

## 2018-09-23 NOTE — Telephone Encounter (Signed)
noted 

## 2018-09-23 NOTE — Telephone Encounter (Signed)
Received fax from [employer] that case had reconsideration and has now been approved. Auth# [id number] dates 09/22/2018-03/21/2019. [facility] is also aware

## 2018-10-16 ENCOUNTER — Other Ambulatory Visit: Payer: Self-pay

## 2018-10-16 ENCOUNTER — Ambulatory Visit (HOSPITAL_COMMUNITY)
Admission: RE | Admit: 2018-10-16 | Discharge: 2018-10-16 | Disposition: A | Discharge status: Home / Self Care | Payer: Medicaid Other | Source: Ambulatory Visit | Attending: Gastroenterology | Admitting: Gastroenterology

## 2018-10-16 DIAGNOSIS — K769 Liver disease, unspecified: Secondary | ICD-10-CM | POA: Diagnosis not present

## 2018-10-16 LAB — POCT I-STAT CREATININE: Creatinine, Ser: 0.8 mg/dL (ref 0.61–1.24)

## 2018-10-16 MED ORDER — GADOBUTROL 1 MMOL/ML IV SOLN
5.0000 mL | Freq: Once | INTRAVENOUS | Status: AC | PRN
Start: 2018-10-16 — End: 2018-10-16
  Administered 2018-10-16: 5 mL via INTRAVENOUS

## 2018-10-20 NOTE — Progress Notes (Signed)
PT is aware.

## 2018-12-22 ENCOUNTER — Encounter: Payer: Self-pay | Admitting: Gastroenterology

## 2018-12-22 ENCOUNTER — Ambulatory Visit: Payer: Medicaid Other | Admitting: Gastroenterology

## 2018-12-22 VITALS — BP 100/73 | HR 112 | Temp 96.8°F | Ht 72.0 in | Wt 115.8 lb

## 2018-12-22 DIAGNOSIS — B182 Chronic viral hepatitis C: Secondary | ICD-10-CM

## 2018-12-22 DIAGNOSIS — K769 Liver disease, unspecified: Secondary | ICD-10-CM

## 2018-12-22 DIAGNOSIS — Z1211 Encounter for screening for malignant neoplasm of colon: Secondary | ICD-10-CM | POA: Diagnosis not present
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
## 2018-12-22 NOTE — Patient Instructions (Signed)
1. Colonoscopy in near future. See separate instructions. 2. We will retrieve labs from the pain clinic and let you know if any additional labs for Hep C are needed at this time.

## 2018-12-23 ENCOUNTER — Encounter: Payer: Self-pay | Admitting: Gastroenterology

## 2018-12-23 ENCOUNTER — Ambulatory Visit: Payer: Medicaid Other | Admitting: Gastroenterology

## 2018-12-23 NOTE — Assessment & Plan Note (Addendum)
History of HCVRNA of 20 previously in October 2018.  Subsequent HCVRNA negative in May 2019.  Given viral load previously low end at the threshold of detectable level, would not be unreasonable to recheck HCVRNA yearly until obtain 3 negative values.  Patient reports recent labs with pain clinic for hepatitis C, we requested.

## 2018-12-23 NOTE — Progress Notes (Signed)
CC'ED TO PCP 

## 2018-12-23 NOTE — Assessment & Plan Note (Signed)
Recent MRI liver unremarkable.

## 2018-12-23 NOTE — Assessment & Plan Note (Signed)
Plan for first-ever screening colonoscopy, deep sedation given remote drug use, alcohol use.  I have discussed the risks, alternatives, benefits with regards to but not limited to the risk of reaction to medication, bleeding, infection, perforation and the patient is agreeable to proceed. Written consent to be obtained.

## 2019-01-13 ENCOUNTER — Telehealth: Payer: Self-pay | Admitting: *Deleted

## 2019-01-13 ENCOUNTER — Encounter: Payer: Self-pay | Admitting: *Deleted

## 2019-01-13 ENCOUNTER — Other Ambulatory Visit: Payer: Self-pay | Admitting: *Deleted

## 2019-01-13 DIAGNOSIS — Z1211 Encounter for screening for malignant neoplasm of colon: Secondary | ICD-10-CM

## 2019-01-13 MED ORDER — CLENPIQ 10-3.5-12 MG-GM -GM/160ML PO SOLN: 1.0000 | Freq: Once | ORAL | 0 refills | Status: AC

## 2019-01-13 NOTE — Telephone Encounter (Signed)
Called patient. He is scheduled for TCS with propofol with [facility] 11/16 at 8:30am. Patient aware will need pre-op and covid-19 testing. I advised will mail this appt information to him with his prep instructions. Confirmed mailing address. Rx sent. Orders entered.

## 2019-01-23 NOTE — Progress Notes (Signed)
Please let patient know we received a copy of labs from pain clinic dated July 2020.  HCV quantitation, HCV not detected.  Plan to check HCV RNA in one year. Please arrange.

## 2019-01-26 ENCOUNTER — Other Ambulatory Visit: Payer: Self-pay

## 2019-01-26 DIAGNOSIS — B182 Chronic viral hepatitis C: Secondary | ICD-10-CM

## 2019-01-26 NOTE — Progress Notes (Signed)
PT is aware and lab order on file for Sept 2021.
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
## 2019-03-24 NOTE — Patient Instructions (Signed)
John Wells  03/24/2019     @PREFPERIOPPHARMACY @   Your procedure is scheduled on  03/30/2019 .  Report to [facility] at  [place].M.  Call this number if you have problems the morning of surgery:  [phone]   Remember:  Follow the diet and prep instructions given to you by Dr Oneida Alar office.                      Take these medicines the morning of surgery with A SIP OF WATER  Flexeril(if needed), gabapentin, oxycodone(if needed).    Do not wear jewelry, make-up or nail polish.  Do not wear lotions, powders, or perfumes. Please wear deodorant and brush your teeth.  Do not shave 48 hours prior to surgery.  Men may shave face and neck.  Do not bring valuables to the hospital.  [facility] is not responsible for any belongings or valuables.  Contacts, dentures or bridgework may not be worn into surgery.  Leave your suitcase in the car.  After surgery it may be brought to your room.  For patients admitted to the hospital, discharge time will be determined by your treatment team.  Patients discharged the day of surgery will not be allowed to drive home.   Name and phone number of your driver:   family Special instructions:  None Please read over the following fact sheets that you were given. Anesthesia Post-op Instructions and Care and Recovery After Surgery       Colonoscopy, Adult, Care After This sheet gives you information about how to care for yourself after your procedure. Your health care provider may also give you more specific instructions. If you have problems or questions, contact your health care provider. What can I expect after the procedure? After the procedure, it is common to have:  A small amount of blood in your stool for 24 hours after the procedure.  Some gas.  Mild abdominal cramping or bloating. Follow these instructions at home: General instructions  For the first 24 hours after the procedure: ? Do not drive or use machinery. ? Do  not sign important documents. ? Do not drink alcohol. ? Do your regular daily activities at a slower pace than normal. ? Eat soft, easy-to-digest foods.  Take over-the-counter or prescription medicines only as told by your health care provider. Relieving cramping and bloating   Try walking around when you have cramps or feel bloated.  Apply heat to your abdomen as told by your health care provider. Use a heat source that your health care provider recommends, such as a moist heat pack or a heating pad. ? Place a towel between your skin and the heat source. ? Leave the heat on for 20-30 minutes. ? Remove the heat if your skin turns bright red. This is especially important if you are unable to feel pain, heat, or cold. You may have a greater risk of getting burned. Eating and drinking   Drink enough fluid to keep your urine pale yellow.  Resume your normal diet as instructed by your health care provider. Avoid heavy or fried foods that are hard to digest.  Avoid drinking alcohol for as long as instructed by your health care provider. Contact a health care provider if:  You have blood in your stool 2-3 days after the procedure. Get help right away if:  You have more than a small spotting of blood in your stool.  You pass large blood clots in your stool.  Your abdomen is swollen.  You have nausea or vomiting.  You have a fever.  You have increasing abdominal pain that is not relieved with medicine. Summary  After the procedure, it is common to have a small amount of blood in your stool. You may also have mild abdominal cramping and bloating.  For the first 24 hours after the procedure, do not drive or use machinery, sign important documents, or drink alcohol.  Contact your health care provider if you have a lot of blood in your stool, nausea or vomiting, a fever, or increased abdominal pain. This information is not intended to replace advice given to you by your health care  provider. Make sure you discuss any questions you have with your health care provider. Document Released: 12/13/2003 Document Revised: 02/20/2017 Document Reviewed: 07/12/2015 Elsevier Patient Education  2020 [place] After These instructions provide you with information about caring for yourself after your procedure. Your health care provider may also give you more specific instructions. Your treatment has been planned according to current medical practices, but problems sometimes occur. Call your health care provider if you have any problems or questions after your procedure. What can I expect after the procedure? After your procedure, you may:  Feel sleepy for several hours.  Feel clumsy and have poor balance for several hours.  Feel forgetful about what happened after the procedure.  Have poor judgment for several hours.  Feel nauseous or vomit.  Have a sore throat if you had a breathing tube during the procedure. Follow these instructions at home: For at least 24 hours after the procedure:      Have a responsible adult stay with you. It is important to have someone help care for you until you are awake and alert.  Rest as needed.  Do not: ? Participate in activities in which you could fall or become injured. ? Drive. ? Use heavy machinery. ? Drink alcohol. ? Take sleeping pills or medicines that cause drowsiness. ? Make important decisions or sign legal documents. ? Take care of children on your own. Eating and drinking  Follow the diet that is recommended by your health care provider.  If you vomit, drink water, juice, or soup when you can drink without vomiting.  Make sure you have little or no nausea before eating solid foods. General instructions  Take over-the-counter and prescription medicines only as told by your health care provider.  If you have sleep apnea, surgery and certain medicines can increase your risk for  breathing problems. Follow instructions from your health care provider about wearing your sleep device: ? Anytime you are sleeping, including during daytime naps. ? While taking prescription pain medicines, sleeping medicines, or medicines that make you drowsy.  If you smoke, do not smoke without supervision.  Keep all follow-up visits as told by your health care provider. This is important. Contact a health care provider if:  You keep feeling nauseous or you keep vomiting.  You feel light-headed.  You develop a rash.  You have a fever. Get help right away if:  You have trouble breathing. Summary  For several hours after your procedure, you may feel sleepy and have poor judgment.  Have a responsible adult stay with you for at least 24 hours or until you are awake and alert. This information is not intended to replace advice given to you by your health care provider. Make sure you  discuss any questions you have with your health care provider. Document Released: 08/21/2015 Document Revised: 07/29/2017 Document Reviewed: 08/21/2015 Elsevier Patient Education  2020 [employer].

## 2019-03-26 ENCOUNTER — Other Ambulatory Visit (HOSPITAL_COMMUNITY)
Admission: RE | Admit: 2019-03-26 | Discharge: 2019-03-26 | Disposition: A | Discharge status: Home / Self Care | Payer: Medicaid Other | Source: Ambulatory Visit | Attending: Gastroenterology | Admitting: Gastroenterology

## 2019-03-26 ENCOUNTER — Encounter (HOSPITAL_COMMUNITY): Payer: Self-pay

## 2019-03-26 ENCOUNTER — Encounter (HOSPITAL_COMMUNITY)
Admission: RE | Admit: 2019-03-26 | Discharge: 2019-03-26 | Disposition: A | Discharge status: Home / Self Care | Payer: Medicaid Other | Source: Ambulatory Visit | Attending: Gastroenterology | Admitting: Gastroenterology

## 2019-03-26 ENCOUNTER — Other Ambulatory Visit: Payer: Self-pay

## 2019-03-26 DIAGNOSIS — Z20828 Contact with and (suspected) exposure to other viral communicable diseases: Secondary | ICD-10-CM | POA: Insufficient documentation

## 2019-03-26 DIAGNOSIS — Z01812 Encounter for preprocedural laboratory examination: Secondary | ICD-10-CM | POA: Insufficient documentation

## 2019-03-26 LAB — CBC WITH DIFFERENTIAL/PLATELET
Abs Immature Granulocytes: 0.03 10*3/uL (ref 0.00–0.07)
Basophils Absolute: 0.1 10*3/uL (ref 0.0–0.1)
Basophils Relative: 1 %
Eosinophils Absolute: 0.3 10*3/uL (ref 0.0–0.5)
Eosinophils Relative: 4 %
HCT: 44.5 % (ref 39.0–52.0)
Hemoglobin: 14.7 g/dL (ref 13.0–17.0)
Immature Granulocytes: 0 %
Lymphocytes Relative: 22 %
Lymphs Abs: 1.8 10*3/uL (ref 0.7–4.0)
MCH: 34 pg (ref 26.0–34.0)
MCHC: 33 g/dL (ref 30.0–36.0)
MCV: 103 fL — ABNORMAL HIGH (ref 80.0–100.0)
Monocytes Absolute: 0.8 10*3/uL (ref 0.1–1.0)
Monocytes Relative: 10 %
Neutro Abs: 5.1 10*3/uL (ref 1.7–7.7)
Neutrophils Relative %: 63 %
Platelets: 225 10*3/uL (ref 150–400)
RBC: 4.32 MIL/uL (ref 4.22–5.81)
RDW: 12.6 % (ref 11.5–15.5)
WBC: 8.1 10*3/uL (ref 4.0–10.5)
nRBC: 0 % (ref 0.0–0.2)

## 2019-03-26 LAB — COMPREHENSIVE METABOLIC PANEL
ALT: 22 U/L (ref 0–44)
AST: 29 U/L (ref 15–41)
Albumin: 4.4 g/dL (ref 3.5–5.0)
Alkaline Phosphatase: 48 U/L (ref 38–126)
Anion gap: 11 (ref 5–15)
BUN: 7 mg/dL (ref 6–20)
CO2: 26 mmol/L (ref 22–32)
Calcium: 9.5 mg/dL (ref 8.9–10.3)
Chloride: 102 mmol/L (ref 98–111)
Creatinine, Ser: 0.66 mg/dL (ref 0.61–1.24)
GFR calc Af Amer: >60 mL/min (ref >60)
GFR calc non Af Amer: >60 mL/min (ref >60)
Glucose, Bld: 98 mg/dL (ref 70–99)
Potassium: 4.2 mmol/L (ref 3.5–5.1)
Sodium: 139 mmol/L (ref 135–145)
Total Bilirubin: 0.9 mg/dL (ref 0.3–1.2)
Total Protein: 7 g/dL (ref 6.5–8.1)

## 2019-03-26 LAB — SARS CORONAVIRUS 2 (TAT 6-24 HRS): SARS Coronavirus 2: NEGATIVE

## 2019-03-26 LAB — PROTIME-INR
INR: 1 (ref 0.8–1.2)
Prothrombin Time: 12.9 seconds (ref 11.4–15.2)

## 2019-03-27 LAB — HEPATITIS C ANTIBODY: HCV Ab: REACTIVE — AB

## 2019-03-30 ENCOUNTER — Encounter (HOSPITAL_COMMUNITY): Payer: Self-pay | Admitting: *Deleted

## 2019-03-30 ENCOUNTER — Ambulatory Visit (HOSPITAL_COMMUNITY): Payer: Medicaid Other | Admitting: Anesthesiology

## 2019-03-30 ENCOUNTER — Encounter (HOSPITAL_COMMUNITY)
Admission: RE | Disposition: A | Discharge status: Home / Self Care | Payer: Self-pay | Source: Home / Self Care | Attending: Gastroenterology

## 2019-03-30 ENCOUNTER — Ambulatory Visit (HOSPITAL_COMMUNITY)
Admission: RE | Admit: 2019-03-30 | Discharge: 2019-03-30 | Disposition: A | Discharge status: Home / Self Care | Payer: Medicaid Other | Attending: Gastroenterology | Admitting: Gastroenterology

## 2019-03-30 DIAGNOSIS — D128 Benign neoplasm of rectum: Secondary | ICD-10-CM | POA: Diagnosis not present

## 2019-03-30 DIAGNOSIS — Q438 Other specified congenital malformations of intestine: Secondary | ICD-10-CM | POA: Diagnosis not present

## 2019-03-30 DIAGNOSIS — K621 Rectal polyp: Secondary | ICD-10-CM | POA: Diagnosis not present

## 2019-03-30 DIAGNOSIS — M1990 Unspecified osteoarthritis, unspecified site: Secondary | ICD-10-CM | POA: Insufficient documentation

## 2019-03-30 DIAGNOSIS — D124 Benign neoplasm of descending colon: Secondary | ICD-10-CM | POA: Insufficient documentation

## 2019-03-30 DIAGNOSIS — Z1211 Encounter for screening for malignant neoplasm of colon: Secondary | ICD-10-CM | POA: Diagnosis present

## 2019-03-30 DIAGNOSIS — Z7951 Long term (current) use of inhaled steroids: Secondary | ICD-10-CM | POA: Diagnosis not present

## 2019-03-30 DIAGNOSIS — J449 Chronic obstructive pulmonary disease, unspecified: Secondary | ICD-10-CM | POA: Diagnosis not present

## 2019-03-30 DIAGNOSIS — K648 Other hemorrhoids: Secondary | ICD-10-CM | POA: Insufficient documentation

## 2019-03-30 DIAGNOSIS — G8929 Other chronic pain: Secondary | ICD-10-CM | POA: Insufficient documentation

## 2019-03-30 DIAGNOSIS — K635 Polyp of colon: Secondary | ICD-10-CM

## 2019-03-30 DIAGNOSIS — F17210 Nicotine dependence, cigarettes, uncomplicated: Secondary | ICD-10-CM | POA: Insufficient documentation

## 2019-03-30 DIAGNOSIS — Z79899 Other long term (current) drug therapy: Secondary | ICD-10-CM | POA: Insufficient documentation

## 2019-03-30 DIAGNOSIS — K644 Residual hemorrhoidal skin tags: Secondary | ICD-10-CM | POA: Diagnosis not present

## 2019-03-30 HISTORY — PX: COLONOSCOPY WITH PROPOFOL: SHX5780

## 2019-03-30 HISTORY — PX: POLYPECTOMY: SHX5525

## 2019-03-30 SURGERY — COLONOSCOPY WITH PROPOFOL
Anesthesia: General

## 2019-03-30 MED ORDER — KETAMINE HCL 50 MG/5ML IJ SOSY
PREFILLED_SYRINGE | INTRAMUSCULAR | Status: AC
  Filled 2019-03-30: qty 5

## 2019-03-30 MED ORDER — PROPOFOL 500 MG/50ML IV EMUL
INTRAVENOUS | Status: DC | PRN
  Administered 2019-03-30: 125 ug/kg/min via INTRAVENOUS

## 2019-03-30 MED ORDER — CHLORHEXIDINE GLUCONATE CLOTH 2 % EX PADS: 6.0000 | MEDICATED_PAD | Freq: Once | CUTANEOUS | Status: DC

## 2019-03-30 MED ORDER — LIDOCAINE HCL (CARDIAC) PF 100 MG/5ML IV SOSY
PREFILLED_SYRINGE | INTRAVENOUS | Status: DC | PRN
  Administered 2019-03-30: 50 mg via INTRAVENOUS

## 2019-03-30 MED ORDER — ONDANSETRON HCL 4 MG/2ML IJ SOLN: 4.0000 mg | Freq: Once | INTRAMUSCULAR | Status: DC | PRN

## 2019-03-30 MED ORDER — LACTATED RINGERS IV SOLN
Freq: Once | INTRAVENOUS | Status: AC
  Administered 2019-03-30: 08:00:00 1000 mL via INTRAVENOUS

## 2019-03-30 MED ORDER — KETAMINE HCL 10 MG/ML IJ SOLN
INTRAMUSCULAR | Status: DC | PRN
  Administered 2019-03-30 (×2): 10 mg via INTRAVENOUS

## 2019-03-30 MED ORDER — LACTATED RINGERS IV SOLN
INTRAVENOUS | Status: DC | PRN
  Administered 2019-03-30: 09:00:00 via INTRAVENOUS

## 2019-03-30 MED ORDER — PROPOFOL 10 MG/ML IV BOLUS
INTRAVENOUS | Status: DC | PRN
  Administered 2019-03-30 (×9): 10 mg via INTRAVENOUS

## 2019-03-30 MED ORDER — GLYCOPYRROLATE PF 0.2 MG/ML IJ SOSY
PREFILLED_SYRINGE | INTRAMUSCULAR | Status: AC
  Filled 2019-03-30: qty 1
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
## 2019-03-30 NOTE — Progress Notes (Signed)
CC'D TO PCP °

## 2019-03-30 NOTE — Anesthesia Postprocedure Evaluation (Signed)
Anesthesia Post Note  Patient: John Wells  Procedure(s) Performed: COLONOSCOPY WITH PROPOFOL (N/A ) POLYPECTOMY  Patient location during evaluation: PACU Anesthesia Type: General Level of consciousness: awake and alert and patient cooperative Pain management: pain level controlled Vital Signs Assessment: post-procedure vital signs reviewed and stable Respiratory status: spontaneous breathing Cardiovascular status: stable Postop Assessment: no apparent nausea or vomiting Anesthetic complications: no     Last Vitals:  Vitals:   03/30/19 0728  BP: 129/90  Pulse: 78  Resp: 18  Temp: 36.8 C  SpO2: 99%    Last Pain:  Vitals:   03/30/19 0904  TempSrc:   PainSc: 0-No pain                 Everette Rank

## 2019-03-30 NOTE — Anesthesia Preprocedure Evaluation (Signed)
Anesthesia Evaluation  Patient identified by MRN, date of birth, ID band  Reviewed: Allergy & Precautions, NPO status , Patient's Chart, lab work & pertinent test results  Airway Mallampati: III  TM Distance: >3 FB    Comment: Neck pain, neck fx Dental  (+) Edentulous Upper, Edentulous Lower   Pulmonary COPD,  COPD inhaler, Current Smoker and Patient abstained from smoking.,    breath sounds clear to auscultation       Cardiovascular Exercise Tolerance: Good  Rhythm:Regular Rate:Normal     Neuro/Psych negative neurological ROS  negative psych ROS   GI/Hepatic negative GI ROS, (+)     substance abuse  marijuana use, Hepatitis -, C  Endo/Other  negative endocrine ROS  Renal/GU      Musculoskeletal  (+) Arthritis , Neck pain   Abdominal   Peds  Hematology negative hematology ROS (+)   Anesthesia Other Findings   Reproductive/Obstetrics                             Anesthesia Physical Anesthesia Plan  ASA: III  Anesthesia Plan: General   Post-op Pain Management:    Induction: Intravenous  PONV Risk Score and Plan: TIVA  Airway Management Planned: Natural Airway, Nasal Cannula and Simple Face Mask  Additional Equipment:   Intra-op Plan:   Post-operative Plan:   Informed Consent: I have reviewed the patients History and Physical, chart, labs and discussed the procedure including the risks, benefits and alternatives for the proposed anesthesia with the patient or authorized representative who has indicated his/her understanding and acceptance.     Dental advisory given  Plan Discussed with: CRNA  Anesthesia Plan Comments:         Anesthesia Quick Evaluation

## 2019-03-30 NOTE — Transfer of Care (Signed)
Immediate Anesthesia Transfer of Care Note  Patient: John Wells  Procedure(s) Performed: COLONOSCOPY WITH PROPOFOL (N/A ) POLYPECTOMY  Patient Location: PACU  Anesthesia Type:MAC  Level of Consciousness: awake and patient cooperative  Airway & Oxygen Therapy: Patient Spontanous Breathing and Patient connected to nasal cannula oxygen  Post-op Assessment: Report given to RN and Post -op Vital signs reviewed and stable  Post vital signs: Reviewed and stable  Last Vitals:  Vitals Value Taken Time  BP    Temp    Pulse 102 03/30/19 0937  Resp 16 03/30/19 0937  SpO2 100 % 03/30/19 0937  Vitals shown include unvalidated device data.  Last Pain:  Vitals:   03/30/19 0904  TempSrc:   PainSc: 0-No pain      Patients Stated Pain Goal: 5 (55/20/80 2233)  Complications: No apparent anesthesia complications

## 2019-03-30 NOTE — Discharge Instructions (Signed)
You had 3 polyps removed. You have  SMALL internal hemorrhoids.   DRINK WATER TO KEEP YOUR URINE LIGHT YELLOW.  FOLLOW A HIGH FIBER DIET. AVOID ITEMS THAT CAUSE BLOATING & GAS. SEE INFO BELOW.   PREPARATION H UP TO 4 TIMES A DAY when needed FOR RECTAL BLEEDING, ITCHING, PRESSURE, BURNING, OR PAIN.   YOUR BIOPSY RESULTS WILL BE BACK IN 5 BUSINESS DAYS.  YOUR next colonoscopy WILL BE SCHEDULED BASED ON YOUR FINAL PATHOLOGY REPORT.   Colonoscopy Care After Read the instructions outlined below and refer to this sheet in the next week. These discharge instructions provide you with general information on caring for yourself after you leave the hospital. While your treatment has been planned according to the most current medical practices available, unavoidable complications occasionally occur. If you have any problems or questions after discharge, call DR. FIELDS, [phone].  ACTIVITY  You may resume your regular activity, but move at a slower pace for the next 24 hours.   Take frequent rest periods for the next 24 hours.   Walking will help get rid of the air and reduce the bloated feeling in your belly (abdomen).   No driving for 24 hours (because of the medicine (anesthesia) used during the test).   You may shower.   Do not sign any important legal documents or operate any machinery for 24 hours (because of the anesthesia used during the test).    NUTRITION  Drink plenty of fluids.   You may resume your normal diet as instructed by your doctor.   Begin with a light meal and progress to your normal diet. Heavy or fried foods are harder to digest and may make you feel sick to your stomach (nauseated).   Avoid alcoholic beverages for 24 hours or as instructed.    MEDICATIONS  You may resume your normal medications.   WHAT YOU CAN EXPECT TODAY  Some feelings of bloating in the abdomen.   Passage of more gas than usual.   Spotting of blood in your stool or on the  toilet paper  .  IF YOU HAD POLYPS REMOVED DURING THE COLONOSCOPY:  Eat a soft diet IF YOU HAVE NAUSEA, BLOATING, ABDOMINAL PAIN, OR VOMITING.    FINDING OUT THE RESULTS OF YOUR TEST Not all test results are available during your visit. DR. Oneida Alar WILL CALL YOU WITHIN 14 DAYS OF YOUR PROCEDUE WITH YOUR RESULTS. Do not assume everything is normal if you have not heard from DR. FIELDS, CALL HER OFFICE AT [phone].  SEEK IMMEDIATE MEDICAL ATTENTION AND CALL THE OFFICE: [phone] IF:  You have more than a spotting of blood in your stool.   Your belly is swollen (abdominal distention).   You are nauseated or vomiting.   You have a temperature over 101F.   You have abdominal pain or discomfort that is severe or gets worse throughout the day.   High-Fiber Diet A high-fiber diet changes your normal diet to include more whole grains, legumes, fruits, and vegetables. Changes in the diet involve replacing refined carbohydrates with unrefined foods. The calorie level of the diet is essentially unchanged. The Dietary Reference Intake (recommended amount) for adult males is 38 grams per day. For adult females, it is 25 grams per day. Pregnant and lactating women should consume 28 grams of fiber per day. Fiber is the intact part of a plant that is not broken down during digestion. Functional fiber is fiber that has been isolated from the plant to provide a  beneficial effect in the body.  PURPOSE  Increase stool bulk.   Ease and regulate bowel movements.   Lower cholesterol.   REDUCE RISK OF COLON CANCER  INDICATIONS THAT YOU NEED MORE FIBER  Constipation and hemorrhoids.   Uncomplicated diverticulosis (intestine condition) and irritable bowel syndrome.   Weight management.   As a protective measure against hardening of the arteries (atherosclerosis), diabetes, and cancer.   GUIDELINES FOR INCREASING FIBER IN THE DIET  Start adding fiber to the diet slowly. A gradual increase  of about 5 more grams (2 servings of most fruits or vegetables) per day is best. Too rapid an increase in fiber may result in constipation, flatulence, and bloating.   Drink enough water and fluids to keep your urine clear or pale yellow. Water, juice, or caffeine-free drinks are recommended. Not drinking enough fluid may cause constipation.   Eat a variety of high-fiber foods rather than one type of fiber.   Try to increase your intake of fiber through using high-fiber foods rather than fiber pills or supplements that contain small amounts of fiber.   The goal is to change the types of food eaten. Do not supplement your present diet with high-fiber foods, but replace foods in your present diet.    Polyps, Colon  A polyp is extra tissue that grows inside your body. Colon polyps grow in the large intestine. The large intestine, also called the colon, is part of your digestive system. It is a long, hollow tube at the end of your digestive tract where your body makes and stores stool. Most polyps are not dangerous. They are benign. This means they are not cancerous. But over time, some types of polyps can turn into cancer. Polyps that are smaller than a pea are usually not harmful. But larger polyps could someday become or may already be cancerous. To be safe, doctors remove all polyps and test them.   WHO GETS POLYPS? Anyone can get polyps, but certain people are more likely than others. You may have a greater chance of getting polyps if:  You are over 50.   You have had polyps before.   Someone in your family has had polyps.   Someone in your family has had cancer of the large intestine.   Find out if someone in your family has had polyps. You may also be more likely to get polyps if you:   Eat a lot of fatty foods   Smoke   Drink alcohol   Do not exercise  Eat too much   PREVENTION There is not one sure way to prevent polyps. You might be able to lower your risk of getting them if  you:  Eat more fruits and vegetables and less fatty food.   Do not smoke.   Avoid alcohol.   Exercise every day.   Lose weight if you are overweight.   Eating more calcium and folate can also lower your risk of getting polyps. Some foods that are rich in calcium are milk, cheese, and broccoli. Some foods that are rich in folate are chickpeas, kidney beans, and spinach.        Monitored Anesthesia Care, Care After These instructions provide you with information about caring for yourself after your procedure. Your health care provider may also give you more specific instructions. Your treatment has been planned according to current medical practices, but problems sometimes occur. Call your health care provider if you have any problems or questions after your procedure. What  can I expect after the procedure? After your procedure, you may:  Feel sleepy for several hours.  Feel clumsy and have poor balance for several hours.  Feel forgetful about what happened after the procedure.  Have poor judgment for several hours.  Feel nauseous or vomit.  Have a sore throat if you had a breathing tube during the procedure. Follow these instructions at home: For at least 24 hours after the procedure:      Have a responsible adult stay with you. It is important to have someone help care for you until you are awake and alert.  Rest as needed.  Do not: ? Participate in activities in which you could fall or become injured. ? Drive. ? Use heavy machinery. ? Drink alcohol. ? Take sleeping pills or medicines that cause drowsiness. ? Make important decisions or sign legal documents. ? Take care of children on your own. Eating and drinking  Follow the diet that is recommended by your health care provider.  If you vomit, drink water, juice, or soup when you can drink without vomiting.  Make sure you have little or no nausea before eating solid foods. General instructions  Take  over-the-counter and prescription medicines only as told by your health care provider.  If you have sleep apnea, surgery and certain medicines can increase your risk for breathing problems. Follow instructions from your health care provider about wearing your sleep device: ? Anytime you are sleeping, including during daytime naps. ? While taking prescription pain medicines, sleeping medicines, or medicines that make you drowsy.  If you smoke, do not smoke without supervision.  Keep all follow-up visits as told by your health care provider. This is important. Contact a health care provider if:  You keep feeling nauseous or you keep vomiting.  You feel light-headed.  You develop a rash.  You have a fever. Get help right away if:  You have trouble breathing. Summary  For several hours after your procedure, you may feel sleepy and have poor judgment.  Have a responsible adult stay with you for at least 24 hours or until you are awake and alert. This information is not intended to replace advice given to you by your health care provider. Make sure you discuss any questions you have with your health care provider. Document Released: 08/21/2015 Document Revised: 07/29/2017 Document Reviewed: 08/21/2015 Elsevier Patient Education  2020 [employer].

## 2019-03-30 NOTE — Op Note (Signed)
[facility] Patient Name: John Wells Procedure Date: 03/30/2019 7:57 AM MRN: [id number] Date of Birth: 1960/09/04 Attending MD: Barney Drain MD, MD CSN: [id number] Age: 58 Admit Type: Outpatient Procedure:                Colonoscopy WITH COLD SNARE POLYPECTOMY Indications:              Screening for colorectal malignant neoplasm Providers:                Barney Drain MD, MD, Otis Peak B. Sharon Seller, RN, Aram Candela Referring MD:             Koleen Distance. Gottschalk Medicines:                Propofol per Anesthesia Complications:            No immediate complications. Estimated Blood Loss:     Estimated blood loss was minimal. Procedure:                Pre-Anesthesia Assessment:                           - Prior to the procedure, a History and Physical                            was performed, and patient medications and                            allergies were reviewed. The patient's tolerance of                            previous anesthesia was also reviewed. The risks                            and benefits of the procedure and the sedation                            options and risks were discussed with the patient.                            All questions were answered, and informed consent                            was obtained. Prior Anticoagulants: The patient has                            taken no previous anticoagulant or antiplatelet                            agents except for aspirin. ASA Grade Assessment: II                            - A patient with mild systemic disease. After  reviewing the risks and benefits, the patient was                            deemed in satisfactory condition to undergo the                            procedure. After obtaining informed consent, the                            colonoscope was passed under direct vision.                            Throughout the procedure, the patient's  blood                            pressure, pulse, and oxygen saturations were                            monitored continuously. The [id number]                            scope was introduced through the anus and advanced                            to the the cecum, identified by appendiceal orifice                            and ileocecal valve. The colonoscopy was somewhat                            difficult due to a tortuous colon. Successful                            completion of the procedure was aided by                            straightening and shortening the scope to obtain                            bowel loop reduction and COLOWRAP. The patient                            tolerated the procedure well. The quality of the                            bowel preparation was good. The ileocecal valve,                            appendiceal orifice, and rectum were photographed. Scope In: 9:15:32 AM Scope Out: 9:29:59 AM Scope Withdrawal Time: 0 hours 11 minutes 52 seconds  Total Procedure Duration: 0 hours 14 minutes 27 seconds  Findings:      Three sessile polyps were found in the rectum and descending colon. The  polyps were 2 to 6 mm in size. These polyps were removed with a cold       snare. Resection and retrieval were complete.      External and internal hemorrhoids were found.      The recto-sigmoid colon and sigmoid colon were mildly tortuous. Impression:               - Three 2 to 6 mm polyps in the rectum(2) and in                            the descending colon, removed with a cold snare.                            Resected and retrieved.                           - External and internal hemorrhoids.                           - Tortuous colon. Moderate Sedation:      Per Anesthesia Care Recommendation:           - Patient has a contact number available for                            emergencies. The signs and symptoms of potential                             delayed complications were discussed with the                            patient. Return to normal activities tomorrow.                            Written discharge instructions were provided to the                            patient.                           - High fiber diet.                           - Continue present medications.                           - Await pathology results.                           - Repeat colonoscopy date to be determined after                            pending pathology results are reviewed for                            surveillance. Procedure Code(s):        --- Professional ---  45385, Colonoscopy, flexible; with removal of                            tumor(s), polyp(s), or other lesion(s) by snare                            technique Diagnosis Code(s):        --- Professional ---                           Z12.11, Encounter for screening for malignant                            neoplasm of colon                           K62.1, Rectal polyp                           K63.5, Polyp of colon                           K64.8, Other hemorrhoids                           Q43.8, Other specified congenital malformations of                            intestine CPT copyright 2019 [employer]. All rights reserved. The codes documented in this report are preliminary and upon coder review may  be revised to meet current compliance requirements. Barney Drain, MD Barney Drain MD, MD 03/30/2019 9:41:58 AM This report has been signed electronically. Number of Addenda: 0

## 2019-03-31 LAB — SURGICAL PATHOLOGY

## 2019-04-02 ENCOUNTER — Encounter (HOSPITAL_COMMUNITY): Payer: Self-pay | Admitting: Gastroenterology

## 2019-12-03 NOTE — Congregational Nurse Program (Signed)
Encounter created in error

## 2019-12-03 NOTE — Progress Notes (Unsigned)
This encounter was created in error - please disregard.

## 2019-12-24 ENCOUNTER — Other Ambulatory Visit: Payer: Self-pay

## 2019-12-24 DIAGNOSIS — B182 Chronic viral hepatitis C: Secondary | ICD-10-CM

## 2020-01-28 LAB — HEPATITIS C RNA QUANTITATIVE
HCV RNA, PCR, QN (Log): <1.18 log IU/mL
HCV RNA, PCR, QN: <15 IU/mL

## 2020-11-03 ENCOUNTER — Other Ambulatory Visit (HOSPITAL_COMMUNITY): Payer: Self-pay | Admitting: Nurse Practitioner

## 2020-11-03 ENCOUNTER — Other Ambulatory Visit: Payer: Self-pay | Admitting: Nurse Practitioner

## 2020-11-03 DIAGNOSIS — Z720 Tobacco use: Secondary | ICD-10-CM

## 2020-11-24 ENCOUNTER — Ambulatory Visit (HOSPITAL_COMMUNITY): Payer: Medicaid Other

## 2020-12-09 ENCOUNTER — Ambulatory Visit: Admission: RE | Admit: 2020-12-09 | Payer: Medicaid Other | Source: Ambulatory Visit

## 2021-01-05 ENCOUNTER — Ambulatory Visit (HOSPITAL_COMMUNITY)
Admission: RE | Admit: 2021-01-05 | Discharge: 2021-01-05 | Disposition: A | Discharge status: Home / Self Care | Payer: Medicaid Other | Source: Ambulatory Visit | Attending: Nurse Practitioner | Admitting: Nurse Practitioner

## 2021-01-05 ENCOUNTER — Other Ambulatory Visit: Payer: Self-pay

## 2021-01-05 DIAGNOSIS — Z720 Tobacco use: Secondary | ICD-10-CM | POA: Diagnosis present

## 2021-01-05 DIAGNOSIS — Z122 Encounter for screening for malignant neoplasm of respiratory organs: Secondary | ICD-10-CM | POA: Insufficient documentation

## 2021-01-05 DIAGNOSIS — F17210 Nicotine dependence, cigarettes, uncomplicated: Secondary | ICD-10-CM | POA: Diagnosis not present

## 2021-05-19 ENCOUNTER — Other Ambulatory Visit (HOSPITAL_COMMUNITY): Payer: Self-pay | Admitting: Nurse Practitioner

## 2021-05-19 DIAGNOSIS — R9431 Abnormal electrocardiogram [ECG] [EKG]: Secondary | ICD-10-CM

## 2021-06-19 ENCOUNTER — Ambulatory Visit (HOSPITAL_COMMUNITY): Admission: RE | Admit: 2021-06-19 | Payer: Medicaid Other | Source: Ambulatory Visit

## 2022-03-13 ENCOUNTER — Encounter: Payer: Self-pay | Admitting: *Deleted

## 2022-04-03 ENCOUNTER — Encounter: Payer: Self-pay | Admitting: *Deleted

## 2022-04-03 NOTE — Patient Instructions (Signed)
  Procedure: colonoscopy  Estimated body mass index is 17.63 kg/m as calculated from the following:   Height as of this encounter: 6' (1.829 m).   Weight as of this encounter: 130 lb (59 kg).   Have you had a colonoscopy before?  Yes, 03/30/19  Do you have family history of colon cancer?  No  Do you have a family history of polyps? No  Previous colonoscopy with polyps removed? Yes   Do you have a history colorectal cancer?   No  Are you diabetic?  No  Do you have a prosthetic or mechanical heart valve? No  Do you have a pacemaker/defibrillator?   No  Have you had endocarditis/atrial fibrillation?  No  Do you use supplemental oxygen/CPAP?  No  Have you had joint replacement within the last 12 months?  No  Do you tend to be constipated or have to use laxatives?  No   Do you have history of alcohol use? If yes, how much and how often.  Yes, not everyday just sometimes  Do you have history or are you using drugs? If yes, what do are you  using?  Yes, marijuana  Have you ever had a stroke/heart attack?  No  Have you ever had a heart or other vascular stent placed,?No  Do you take weight loss medication? No  Do you take any blood-thinning medications such as: (Plavix, aspirin, Coumadin, Aggrenox, Brilinta, Xarelto, Eliquis, Pradaxa, Savaysa or Effient)? No  If yes we need the name, milligram, dosage and who is prescribing doctor:  n/a             Current Outpatient Medications  Medication Sig Dispense Refill   albuterol (VENTOLIN HFA) 108 (90 Base) MCG/ACT inhaler Inhale 1-2 puffs into the lungs every 6 (six) hours as needed for shortness of breath.      cyclobenzaprine (FLEXERIL) 10 MG tablet Take 1 tablet by mouth 2 (two) times daily.      lisinopril (ZESTRIL) 10 MG tablet Take 10 mg by mouth daily.     oxyCODONE-acetaminophen (PERCOCET) 10-325 MG tablet Take 1 tablet by mouth 3 (three) times daily.     SYMBICORT 160-4.5 MCG/ACT inhaler Inhale 2 puffs into the lungs 2  (two) times daily.     No current facility-administered medications for this visit.    No Known Allergies

## 2022-04-18 ENCOUNTER — Encounter: Payer: Self-pay | Admitting: Gastroenterology

## 2022-05-12 IMAGING — CT CT CHEST LUNG CANCER SCREENING LOW DOSE W/O CM
1 series · 10 of 10 positions shown, 13 images · non-contrast
Comparison: CT chest dated March 30, 2011

CLINICAL DATA: Current smoker with 125 pack-year history

EXAM:
CT CHEST WITHOUT CONTRAST LOW-DOSE FOR LUNG CANCER SCREENING
TECHNIQUE: Multidetector CT imaging of the chest was performed following the
standard protocol without IV contrast.

[ct lung segmentation data · axial · 0.56mm/px · z∈[+1170,+1170]mm · 10 of 347 frames shown]
[frame 1/347  mediastinal]
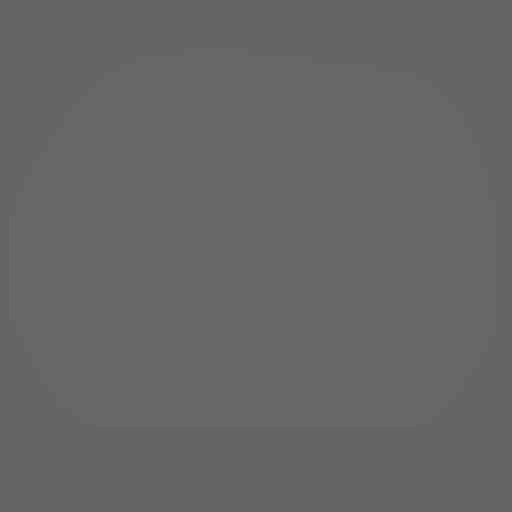
[frame 1/347  lung]
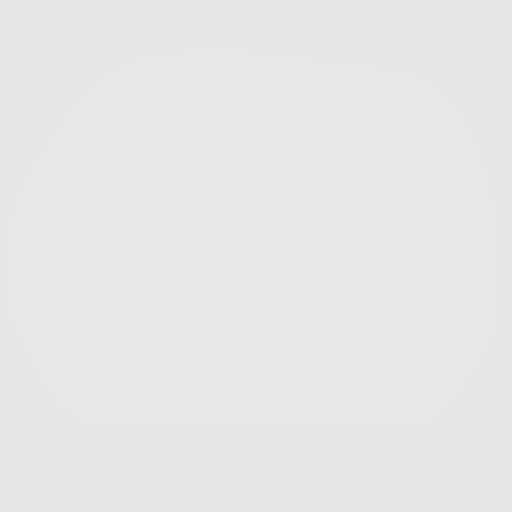
[frame 39/347  lung]
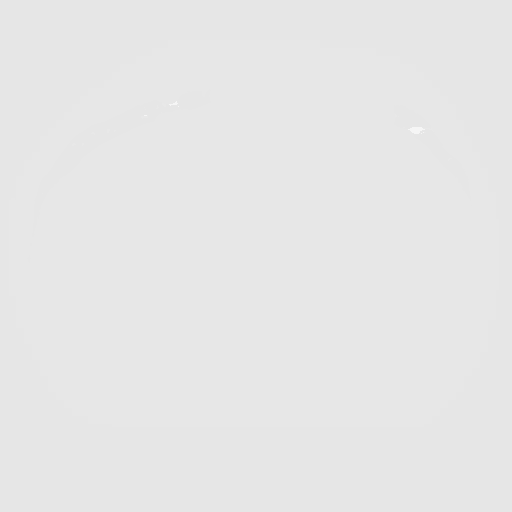
[frame 77/347  lung]
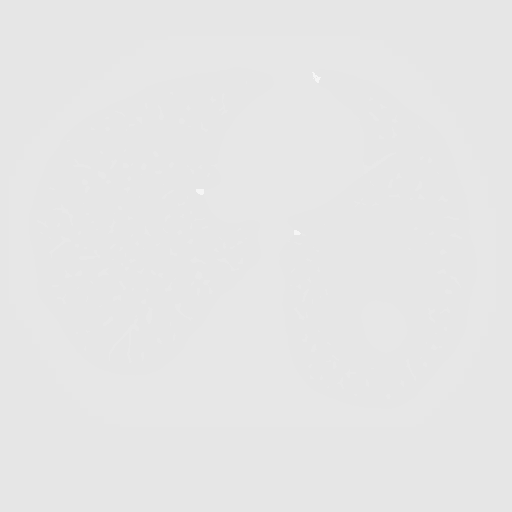
[frame 116/347  lung]
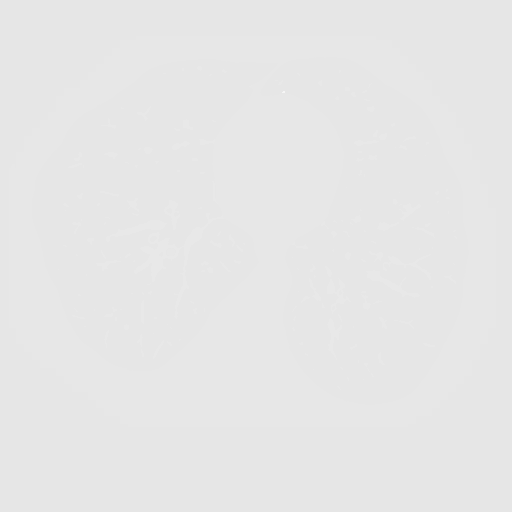
[frame 154/347  mediastinal]
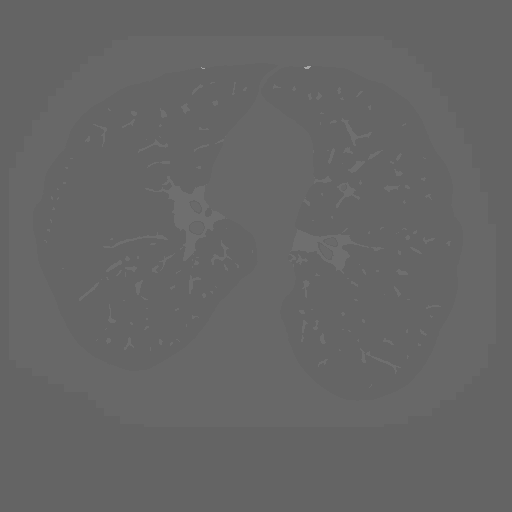
[frame 154/347  lung]
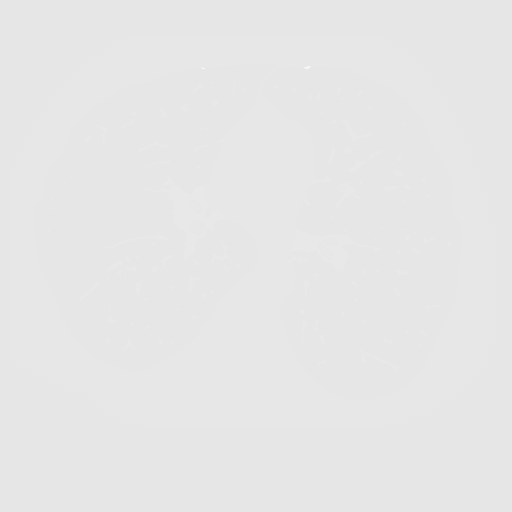
[frame 193/347  lung]
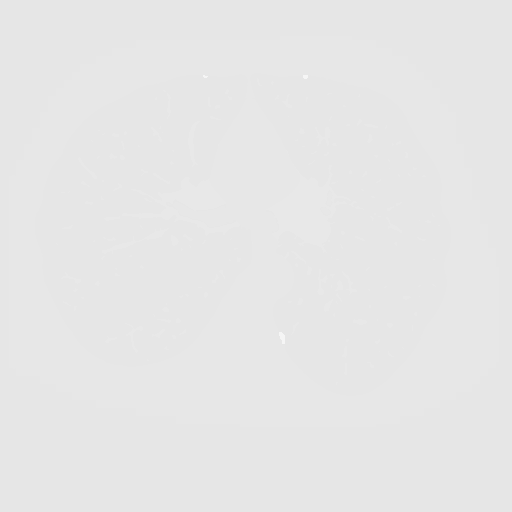
[frame 231/347  lung]
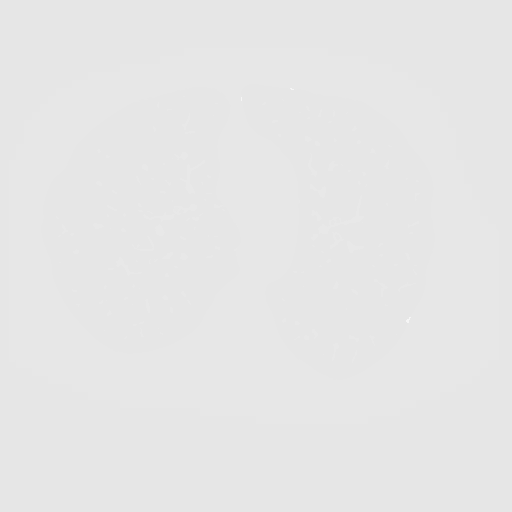
[frame 270/347  lung]
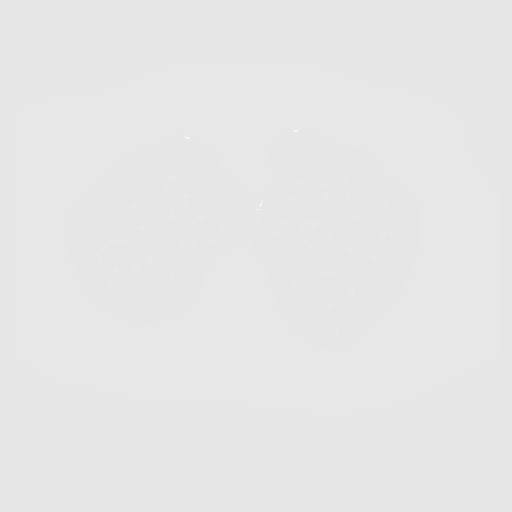
[frame 308/347  mediastinal]
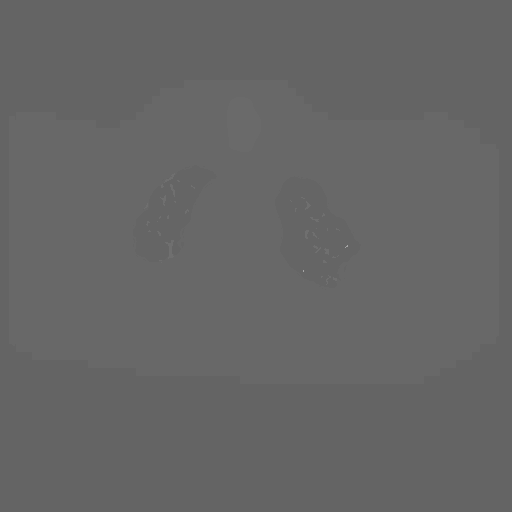
[frame 308/347  lung]
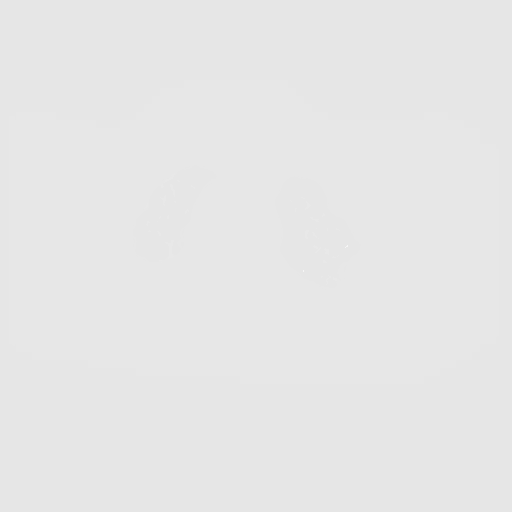
[frame 347/347  lung]
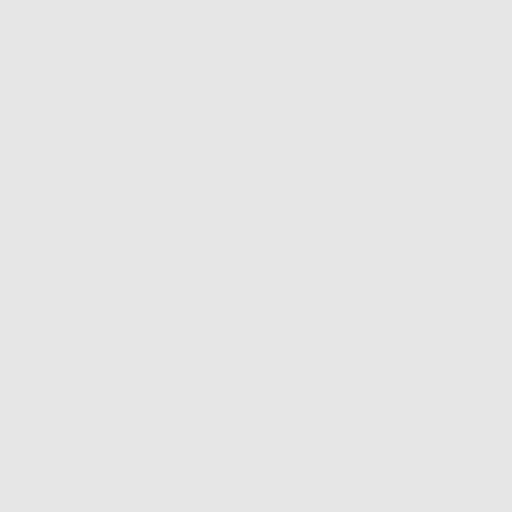

[10 of 10 positions shown; findings below may reference images not displayed]

FINDINGS: Cardiovascular: Normal heart size. Minimal coronary artery
calcifications. Atherosclerotic disease of the thoracic aorta.

Mediastinum/Nodes: No pathologically enlarged lymph nodes seen in
the chest. Mildly patulous esophagus. Thyroid is unremarkable.

Lungs/Pleura: Central airways are patent. Paraseptal emphysema. No
consolidation, pleural effusion or pneumothorax. Scattered small
calcified and solid nodules. For reference 2 mm solid nodule of the
right upper lobe located on series 4, image 100.

Upper Abdomen: No acute abnormality.

Musculoskeletal: No chest wall mass or suspicious bone lesions
identified.
IMPRESSION: Lung-RADS 2, benign appearance or behavior. Continue annual
screening with low-dose chest CT without contrast in 12 months.

Aortic Atherosclerosis (5FCXX-FB4.4) and Emphysema (5FCXX-HH7.P).

## 2022-05-28 ENCOUNTER — Other Ambulatory Visit: Payer: Self-pay
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
## 2022-06-04 ENCOUNTER — Ambulatory Visit: Payer: Medicaid Other | Admitting: Gastroenterology

## 2023-02-04 ENCOUNTER — Ambulatory Visit: Payer: Medicaid Other | Admitting: Family Medicine

## 2023-02-07 ENCOUNTER — Encounter: Payer: Self-pay | Admitting: Family Medicine

## 2024-01-30 ENCOUNTER — Ambulatory Visit: Payer: Self-pay | Admitting: Family Medicine

## 2024-02-17 ENCOUNTER — Ambulatory Visit: Admitting: Nurse Practitioner

## 2024-06-11 ENCOUNTER — Encounter (HOSPITAL_COMMUNITY): Payer: Self-pay

## 2024-06-11 ENCOUNTER — Emergency Department (HOSPITAL_COMMUNITY)

## 2024-06-11 ENCOUNTER — Observation Stay (HOSPITAL_COMMUNITY)
Admission: EM | Admit: 2024-06-11 | Discharge: 2024-06-15 | DRG: 482 | Disposition: A | Attending: Family Medicine | Admitting: Family Medicine

## 2024-06-11 ENCOUNTER — Other Ambulatory Visit: Payer: Self-pay

## 2024-06-11 DIAGNOSIS — J449 Chronic obstructive pulmonary disease, unspecified: Secondary | ICD-10-CM | POA: Diagnosis present

## 2024-06-11 DIAGNOSIS — Z7982 Long term (current) use of aspirin: Secondary | ICD-10-CM

## 2024-06-11 DIAGNOSIS — W000XXA Fall on same level due to ice and snow, initial encounter: Secondary | ICD-10-CM | POA: Diagnosis present

## 2024-06-11 DIAGNOSIS — S7290XA Unspecified fracture of unspecified femur, initial encounter for closed fracture: Secondary | ICD-10-CM | POA: Diagnosis present

## 2024-06-11 DIAGNOSIS — Z7951 Long term (current) use of inhaled steroids: Secondary | ICD-10-CM

## 2024-06-11 DIAGNOSIS — S7291XA Unspecified fracture of right femur, initial encounter for closed fracture: Secondary | ICD-10-CM | POA: Diagnosis not present

## 2024-06-11 DIAGNOSIS — G894 Chronic pain syndrome: Secondary | ICD-10-CM | POA: Diagnosis present

## 2024-06-11 DIAGNOSIS — S72141A Displaced intertrochanteric fracture of right femur, initial encounter for closed fracture: Principal | ICD-10-CM | POA: Diagnosis present

## 2024-06-11 DIAGNOSIS — I10 Essential (primary) hypertension: Secondary | ICD-10-CM | POA: Diagnosis present

## 2024-06-11 DIAGNOSIS — Y9301 Activity, walking, marching and hiking: Secondary | ICD-10-CM | POA: Diagnosis present

## 2024-06-11 DIAGNOSIS — Z79899 Other long term (current) drug therapy: Secondary | ICD-10-CM

## 2024-06-11 DIAGNOSIS — D649 Anemia, unspecified: Secondary | ICD-10-CM | POA: Diagnosis not present

## 2024-06-11 DIAGNOSIS — Z8719 Personal history of other diseases of the digestive system: Secondary | ICD-10-CM

## 2024-06-11 DIAGNOSIS — Y92481 Parking lot as the place of occurrence of the external cause: Secondary | ICD-10-CM

## 2024-06-11 DIAGNOSIS — F17210 Nicotine dependence, cigarettes, uncomplicated: Secondary | ICD-10-CM | POA: Diagnosis present

## 2024-06-11 LAB — CBC WITH DIFFERENTIAL/PLATELET
Abs Immature Granulocytes: 0.06 10*3/uL (ref 0.00–0.07)
Basophils Absolute: 0.1 10*3/uL (ref 0.0–0.1)
Basophils Relative: 1 %
Eosinophils Absolute: 0.6 10*3/uL — ABNORMAL HIGH (ref 0.0–0.5)
Eosinophils Relative: 4 %
HCT: 43.5 % (ref 39.0–52.0)
Hemoglobin: 14.2 g/dL (ref 13.0–17.0)
Immature Granulocytes: 0 %
Lymphocytes Relative: 12 %
Lymphs Abs: 1.7 10*3/uL (ref 0.7–4.0)
MCH: 32 pg (ref 26.0–34.0)
MCHC: 32.6 g/dL (ref 30.0–36.0)
MCV: 98 fL (ref 80.0–100.0)
Monocytes Absolute: 0.8 10*3/uL (ref 0.1–1.0)
Monocytes Relative: 5 %
Neutro Abs: 10.8 10*3/uL — ABNORMAL HIGH (ref 1.7–7.7)
Neutrophils Relative %: 78 %
Platelets: 308 10*3/uL (ref 150–400)
RBC: 4.44 MIL/uL (ref 4.22–5.81)
RDW: 13.3 % (ref 11.5–15.5)
WBC: 14 10*3/uL — ABNORMAL HIGH (ref 4.0–10.5)
nRBC: 0 % (ref 0.0–0.2)

## 2024-06-11 LAB — BASIC METABOLIC PANEL WITH GFR
Anion gap: 11 (ref 5–15)
BUN: 10 mg/dL (ref 8–23)
CO2: 29 mmol/L (ref 22–32)
Calcium: 9.3 mg/dL (ref 8.9–10.3)
Chloride: 99 mmol/L (ref 98–111)
Creatinine, Ser: 0.89 mg/dL (ref 0.61–1.24)
GFR, Estimated: >60 mL/min
Glucose, Bld: 85 mg/dL (ref 70–99)
Potassium: 4.2 mmol/L (ref 3.5–5.1)
Sodium: 139 mmol/L (ref 135–145)

## 2024-06-11 MED ORDER — MELATONIN 3 MG PO TABS
6.0000 mg | ORAL_TABLET | Freq: Every evening | ORAL | Status: AC | PRN
  Administered 2024-06-13: 6 mg via ORAL
  Filled 2024-06-11: qty 2

## 2024-06-11 MED ORDER — HYDROMORPHONE HCL 1 MG/ML IJ SOLN
1.0000 mg | Freq: Once | INTRAMUSCULAR | Status: AC
  Administered 2024-06-12: 1 mg via INTRAVENOUS
  Filled 2024-06-11: qty 1

## 2024-06-11 MED ORDER — LACTATED RINGERS IV SOLN: INTRAVENOUS | Status: AC

## 2024-06-11 MED ORDER — PROCHLORPERAZINE EDISYLATE 10 MG/2ML IJ SOLN: 5.0000 mg | Freq: Four times a day (QID) | INTRAMUSCULAR | Status: AC | PRN

## 2024-06-11 MED ORDER — NICOTINE 14 MG/24HR TD PT24
14.0000 mg | MEDICATED_PATCH | Freq: Every day | TRANSDERMAL | Status: AC
  Administered 2024-06-12 – 2024-06-15 (×5): 14 mg via TRANSDERMAL
  Filled 2024-06-11 (×5): qty 1

## 2024-06-11 MED ORDER — HYDROMORPHONE HCL 1 MG/ML IJ SOLN
0.5000 mg | INTRAMUSCULAR | Status: AC | PRN
  Administered 2024-06-12 – 2024-06-13 (×6): 1 mg via INTRAVENOUS
  Filled 2024-06-11 (×6): qty 1

## 2024-06-11 MED ORDER — FENTANYL CITRATE (PF) 100 MCG/2ML IJ SOLN
50.0000 ug | Freq: Once | INTRAMUSCULAR | Status: AC
  Administered 2024-06-11: 50 ug via INTRAVENOUS
  Filled 2024-06-11: qty 2

## 2024-06-11 MED ORDER — POLYETHYLENE GLYCOL 3350 17 G PO PACK: 17.0000 g | PACK | Freq: Every day | ORAL | Status: AC | PRN

## 2024-06-11 MED ORDER — IPRATROPIUM-ALBUTEROL 0.5-2.5 (3) MG/3ML IN SOLN: 3.0000 mL | RESPIRATORY_TRACT | Status: DC | PRN

## 2024-06-11 MED ORDER — ACETAMINOPHEN 325 MG PO TABS
650.0000 mg | ORAL_TABLET | Freq: Four times a day (QID) | ORAL | Status: DC | PRN
  Administered 2024-06-14 – 2024-06-15 (×3): 650 mg via ORAL
  Filled 2024-06-11 (×3): qty 2

## 2024-06-11 MED ORDER — FLUTICASONE FUROATE-VILANTEROL 100-25 MCG/ACT IN AEPB
1.0000 | INHALATION_SPRAY | Freq: Every day | RESPIRATORY_TRACT | Status: AC
  Administered 2024-06-12 – 2024-06-15 (×4): 1 via RESPIRATORY_TRACT
  Filled 2024-06-11: qty 28

## 2024-06-11 MED ORDER — OXYCODONE HCL 5 MG PO TABS
5.0000 mg | ORAL_TABLET | ORAL | Status: AC | PRN
  Administered 2024-06-12 – 2024-06-14 (×6): 10 mg via ORAL
  Filled 2024-06-11 (×6): qty 2

## 2024-06-11 NOTE — ED Triage Notes (Signed)
 Pt BIB [facility] for fall on ice. Pt slipped and landed on R hip and knee. Denies LOC and denies hitting head.
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
## 2024-06-11 NOTE — ED Provider Notes (Signed)
 " John Wells EMERGENCY DEPARTMENT AT [facility] Provider Note   CSN: [id number] Arrival date & time: 06/11/24  2044     Patient presents with: John Wells is a 64 y.o. male patient with history of tobacco abuse, COPD, and chronic pain who presents to the emergency department today for further evaluation of right hip pain which occurred after a mechanical slip and fall on some ice.  Patient was unable to get up secondary to pain.  He laid on the ground for approximately 30 to 45 minutes.  Patient unable to move his leg secondary to pain.  He did not hit his head nor did he lose consciousness.  Patient is not anticoagulated.    Fall       Prior to Admission medications  Medication Sig Start Date End Date Taking? Authorizing Provider  albuterol  (VENTOLIN  HFA) 108 (90 Base) MCG/ACT inhaler Inhale 1-2 puffs into the lungs every 6 (six) hours as needed for shortness of breath.  08/20/18   [provider]  cyclobenzaprine (FLEXERIL) 10 MG tablet Take 1 tablet by mouth 2 (two) times daily.  11/26/18   [provider]  lisinopril (ZESTRIL) 10 MG tablet Take 10 mg by mouth daily. 08/20/18   [provider]  oxyCODONE -acetaminophen  (PERCOCET) 10-325 MG tablet Take 1 tablet by mouth 3 (three) times daily. 09/09/18   [provider]  SYMBICORT 160-4.5 MCG/ACT inhaler Inhale 2 puffs into the lungs 2 (two) times daily. 08/20/18   [provider]    Allergies: Patient has no known allergies.    Review of Systems  All other systems reviewed and are negative.   Updated Vital Signs BP 136/83   Pulse 68   Temp 97.7 F (36.5 C) (Oral)   Resp 20   Wt 63.5 kg   SpO2 96%   BMI 18.99 kg/m   Physical Exam Vitals and nursing note reviewed.  Constitutional:      Appearance: Normal appearance.  HENT:     Head: Normocephalic and atraumatic.  Eyes:     General:        Right eye: No discharge.        Left eye: No discharge.      Conjunctiva/sclera: Conjunctivae normal.  Pulmonary:     Effort: Pulmonary effort is normal.  Musculoskeletal:     Comments: Right leg is externally rotated and shortened.  Obvious deformity at the hip.  2+ dorsalis pedis pulse felt bilaterally in both feet.  Neurologically intact in the feet.  Good cap refill.  Skin:    General: Skin is warm and dry.     Findings: No rash.  Neurological:     General: No focal deficit present.     Mental Status: He is alert.  Psychiatric:        Mood and Affect: Mood normal.        Behavior: Behavior normal.     (all labs ordered are listed, but only abnormal results are displayed) Labs Reviewed  CBC WITH DIFFERENTIAL/PLATELET - Abnormal; Notable for the following components:      Result Value   WBC 14.0 (*)    Neutro Abs 10.8 (*)    Eosinophils Absolute 0.6 (*)    All other components within normal limits  BASIC METABOLIC PANEL WITH GFR    EKG: None  Radiology: DG FEMUR PORT, 1V RIGHT Result Date: 06/11/2024 EXAM: 1 VIEW XRAY OF THE RIGHT FEMUR 06/11/2024 09:34:00 PM COMPARISON: None available. CLINICAL  HISTORY: Fall. FINDINGS: BONES AND JOINTS: Comminuted and displaced intertrochanteric fracture of the proximal right femur. Varus angulation and external rotation of the distal fracture fragment. Avulsion of the lesser trochanter. SOFT TISSUES: Unremarkable. IMPRESSION: 1. Comminuted and displaced intertrochanteric fracture of the proximal right femur with varus angulation and external rotation of the distal fracture fragment. 2. Avulsion of the lesser trochanter. Electronically signed by: Dorethia Molt MD 06/11/2024 09:43 PM EST RP Workstation: [id number]   DG Knee Right Port Result Date: 06/11/2024 EXAM: 1-2 VIEW(S) XRAY OF THE KNEE 06/11/2024 09:34:00 PM COMPARISON: None available. CLINICAL HISTORY: Fall. FINDINGS: BONES AND JOINTS: No acute fracture. No malalignment. No significant joint effusion. SOFT TISSUES: Vascular calcifications.  IMPRESSION: 1. No evidence of acute traumatic injury. 2. Vascular calcifications. Electronically signed by: Dorethia Molt MD 06/11/2024 09:42 PM EST RP Workstation: [id number]   DG Hip Unilat  With Pelvis 2-3 Views Right Result Date: 06/11/2024 EXAM: 2-3 VIEW(S) XRAY OF THE PELVIS AND RIGHT HIP 06/11/2024 09:34:00 PM COMPARISON: None available. CLINICAL HISTORY: Fall. FINDINGS: BONES AND JOINTS: SI joints are symmetric. Acute comminuted right intertrochanteric fracture with mild impaction and varus angulation at fracture site. Avulsion of the lesser trochanter. The left hip demonstrates normal alignment. SOFT TISSUES: Vascular calcifications. IMPRESSION: 1. Acute comminuted right intertrochanteric fracture with mild impaction and varus angulation at the fracture site, with avulsion of the lesser trochanter. 2. Vascular calcifications. Electronically signed by: Dorethia Molt MD 06/11/2024 09:42 PM EST RP Workstation: [id number]    Procedures   Medications Ordered in the ED  HYDROmorphone  (DILAUDID ) injection 1 mg (has no administration in time range)  fentaNYL  (SUBLIMAZE ) injection 50 mcg (50 mcg Intravenous Given 06/11/24 2159)    Clinical Course as of 06/11/24 2327  Thu Jun 11, 2024  2148 I spoke with Dr. Margrette with orthopedics who agrees to consult the patient.  Plan to admit to the hospitalist service. [CF]  2254 CBC with Differential(!) There is evidence of leukocytosis. [CF]  2254 Basic metabolic panel Negative. [CF]  2254 DG FEMUR PORT, 1V RIGHT Comminuted intertrochanteric fracture to the right hip.  I do agree with the radiologist interpretation. [CF]  2254 DG Knee Right Port Negative. [CF]  2254 DG Hip Unilat  With Pelvis 2-3 Views Right Intertrochanteric fracture as seen on the right femur view.  I do agree with radiologist interpretation. [CF]  2321 I spoke with Dr. Shona with [facility] who agrees to admit the patient. [CF]    Clinical Course User Index [CF] Theotis Cameron HERO, PA-C    Medical Decision Making John Wells is a 64 y.o. male patient who presents to the emergency department today for further evaluation of right hip pain after mechanical slip and fall.  There is external rotation of the right leg and shortening.  Certainly concern for hip fracture.  I we will get imaging to further assess this.  Will give him some pain medication.  Low suspicion for any intracranial hemorrhage or intracervical spinal fracture at this time.  Obvious intertrochanteric fracture on the right.  I spoke with Dr. Margrette with orthopedics who agrees to consult on the patient.  Will plan to get him admitted.  Will keep him n.p.o. after midnight.  I spoke with Dr. Shona who agrees admit the patient.  Patient stable for admission at this time.   Amount and/or Complexity of Data Reviewed Labs: ordered. Decision-making details documented in ED Course. Radiology: ordered. Decision-making details documented in ED Course.  Risk Prescription drug management. Decision regarding  hospitalization.     Final diagnoses:  Closed displaced intertrochanteric fracture of right femur, initial encounter [facility])    ED Discharge Orders     None          Theotis Cameron HERO, [place] 06/11/24 2327    Gennaro Bouchard L, DO 06/13/24 1452  "

## 2024-06-12 ENCOUNTER — Observation Stay (HOSPITAL_COMMUNITY): Admitting: Certified Registered"

## 2024-06-12 ENCOUNTER — Observation Stay (HOSPITAL_COMMUNITY)

## 2024-06-12 ENCOUNTER — Encounter (HOSPITAL_COMMUNITY): Admission: EM | Disposition: A | Payer: Self-pay | Source: Home / Self Care | Attending: Family Medicine

## 2024-06-12 DIAGNOSIS — S72141A Displaced intertrochanteric fracture of right femur, initial encounter for closed fracture: Secondary | ICD-10-CM

## 2024-06-12 LAB — BASIC METABOLIC PANEL WITH GFR
Anion gap: 12 (ref 5–15)
BUN: 9 mg/dL (ref 8–23)
CO2: 24 mmol/L (ref 22–32)
Calcium: 8.7 mg/dL — ABNORMAL LOW (ref 8.9–10.3)
Chloride: 101 mmol/L (ref 98–111)
Creatinine, Ser: 0.77 mg/dL (ref 0.61–1.24)
GFR, Estimated: >60 mL/min
Glucose, Bld: 112 mg/dL — ABNORMAL HIGH (ref 70–99)
Potassium: 4 mmol/L (ref 3.5–5.1)
Sodium: 137 mmol/L (ref 135–145)

## 2024-06-12 LAB — CBC
HCT: 37.9 % — ABNORMAL LOW (ref 39.0–52.0)
Hemoglobin: 12.5 g/dL — ABNORMAL LOW (ref 13.0–17.0)
MCH: 32.5 pg (ref 26.0–34.0)
MCHC: 33 g/dL (ref 30.0–36.0)
MCV: 98.4 fL (ref 80.0–100.0)
Platelets: 286 10*3/uL (ref 150–400)
RBC: 3.85 MIL/uL — ABNORMAL LOW (ref 4.22–5.81)
RDW: 13.4 % (ref 11.5–15.5)
WBC: 13.8 10*3/uL — ABNORMAL HIGH (ref 4.0–10.5)
nRBC: 0 % (ref 0.0–0.2)

## 2024-06-12 LAB — PHOSPHORUS: Phosphorus: 3.5 mg/dL (ref 2.5–4.6)

## 2024-06-12 LAB — SURGICAL PCR SCREEN
MRSA, PCR: POSITIVE — AB
Staphylococcus aureus: POSITIVE — AB

## 2024-06-12 LAB — MAGNESIUM: Magnesium: 2 mg/dL (ref 1.7–2.4)

## 2024-06-12 MED ORDER — BUPIVACAINE-EPINEPHRINE (PF) 0.5% -1:200000 IJ SOLN
INTRAMUSCULAR | Status: AC
  Filled 2024-06-12: qty 30

## 2024-06-12 MED ORDER — BUPIVACAINE-EPINEPHRINE (PF) 0.25% -1:200000 IJ SOLN
INTRAMUSCULAR | Status: AC
  Filled 2024-06-12: qty 30

## 2024-06-12 MED ORDER — MIDAZOLAM HCL (PF) 2 MG/2ML IJ SOLN
INTRAMUSCULAR | Status: DC | PRN
  Administered 2024-06-12: 2 mg via INTRAVENOUS

## 2024-06-12 MED ORDER — SODIUM CHLORIDE 0.9 % IV SOLN: INTRAVENOUS | Status: DC

## 2024-06-12 MED ORDER — ENOXAPARIN SODIUM 40 MG/0.4ML IJ SOSY
40.0000 mg | PREFILLED_SYRINGE | INTRAMUSCULAR | Status: AC
  Administered 2024-06-13: 40 mg via SUBCUTANEOUS
  Filled 2024-06-12: qty 0.4

## 2024-06-12 MED ORDER — TRANEXAMIC ACID-NACL 1000-0.7 MG/100ML-% IV SOLN
1000.0000 mg | Freq: Once | INTRAVENOUS | Status: AC
  Administered 2024-06-12: 1000 mg via INTRAVENOUS
  Filled 2024-06-12: qty 100

## 2024-06-12 MED ORDER — CHLORHEXIDINE GLUCONATE 4 % EX SOLN
60.0000 mL | Freq: Once | CUTANEOUS | Status: AC
  Administered 2024-06-12: 4 via TOPICAL

## 2024-06-12 MED ORDER — DEXAMETHASONE SOD PHOSPHATE PF 10 MG/ML IJ SOLN
INTRAMUSCULAR | Status: AC
  Filled 2024-06-12: qty 1

## 2024-06-12 MED ORDER — OXYCODONE HCL 5 MG PO TABS: 5.0000 mg | ORAL_TABLET | Freq: Once | ORAL | Status: DC | PRN

## 2024-06-12 MED ORDER — CHLORHEXIDINE GLUCONATE CLOTH 2 % EX PADS
6.0000 | MEDICATED_PAD | Freq: Every day | CUTANEOUS | Status: DC
  Administered 2024-06-12 – 2024-06-13 (×2): 6 via TOPICAL

## 2024-06-12 MED ORDER — MIDAZOLAM HCL 2 MG/2ML IJ SOLN
INTRAMUSCULAR | Status: AC
  Filled 2024-06-12: qty 2

## 2024-06-12 MED ORDER — MENTHOL 3 MG MT LOZG: 1.0000 | LOZENGE | OROMUCOSAL | Status: DC | PRN

## 2024-06-12 MED ORDER — VANCOMYCIN HCL IN DEXTROSE 1-5 GM/200ML-% IV SOLN
1000.0000 mg | Freq: Two times a day (BID) | INTRAVENOUS | Status: AC
  Administered 2024-06-13: 1000 mg via INTRAVENOUS
  Filled 2024-06-12: qty 200

## 2024-06-12 MED ORDER — SENNA 8.6 MG PO TABS
1.0000 | ORAL_TABLET | Freq: Two times a day (BID) | ORAL | Status: DC
  Administered 2024-06-12 – 2024-06-15 (×6): 8.6 mg via ORAL
  Filled 2024-06-12 (×6): qty 1

## 2024-06-12 MED ORDER — PROPOFOL 10 MG/ML IV BOLUS
INTRAVENOUS | Status: DC | PRN
  Administered 2024-06-12: 150 mg via INTRAVENOUS
  Administered 2024-06-12: 50 mg via INTRAVENOUS

## 2024-06-12 MED ORDER — PROPOFOL 10 MG/ML IV BOLUS
INTRAVENOUS | Status: AC
  Filled 2024-06-12: qty 20

## 2024-06-12 MED ORDER — ONDANSETRON HCL 4 MG/2ML IJ SOLN
INTRAMUSCULAR | Status: DC | PRN
  Administered 2024-06-12: 4 mg via INTRAVENOUS

## 2024-06-12 MED ORDER — PHENYLEPHRINE HCL-NACL 20-0.9 MG/250ML-% IV SOLN
INTRAVENOUS | Status: AC
  Filled 2024-06-12: qty 250

## 2024-06-12 MED ORDER — METOCLOPRAMIDE HCL 10 MG PO TABS: 5.0000 mg | ORAL_TABLET | Freq: Three times a day (TID) | ORAL | Status: DC | PRN

## 2024-06-12 MED ORDER — FENTANYL CITRATE (PF) 100 MCG/2ML IJ SOLN
INTRAMUSCULAR | Status: AC
  Filled 2024-06-12: qty 2

## 2024-06-12 MED ORDER — CHLORHEXIDINE GLUCONATE 0.12 % MT SOLN: 15.0000 mL | Freq: Once | OROMUCOSAL | Status: DC

## 2024-06-12 MED ORDER — METOCLOPRAMIDE HCL 5 MG/ML IJ SOLN: 5.0000 mg | Freq: Three times a day (TID) | INTRAMUSCULAR | Status: DC | PRN

## 2024-06-12 MED ORDER — ONDANSETRON HCL 4 MG/2ML IJ SOLN
INTRAMUSCULAR | Status: AC
  Filled 2024-06-12: qty 2

## 2024-06-12 MED ORDER — BUPIVACAINE-EPINEPHRINE (PF) 0.5% -1:200000 IJ SOLN
INTRAMUSCULAR | Status: DC | PRN
  Administered 2024-06-12: 30 mL via PERINEURAL

## 2024-06-12 MED ORDER — TRANEXAMIC ACID-NACL 1000-0.7 MG/100ML-% IV SOLN
1000.0000 mg | INTRAVENOUS | Status: AC
  Administered 2024-06-12: 1000 mg via INTRAVENOUS
  Filled 2024-06-12: qty 100

## 2024-06-12 MED ORDER — BISACODYL 5 MG PO TBEC: 5.0000 mg | DELAYED_RELEASE_TABLET | Freq: Every day | ORAL | Status: DC | PRN

## 2024-06-12 MED ORDER — FENTANYL CITRATE (PF) 100 MCG/2ML IJ SOLN
INTRAMUSCULAR | Status: DC | PRN
  Administered 2024-06-12 (×5): 25 ug via INTRAVENOUS
  Administered 2024-06-12: 75 ug via INTRAVENOUS
  Administered 2024-06-12: 25 ug via INTRAVENOUS

## 2024-06-12 MED ORDER — SENNA 8.6 MG PO TABS: 1.0000 | ORAL_TABLET | Freq: Every day | ORAL | Status: DC

## 2024-06-12 MED ORDER — MUPIROCIN 2 % EX OINT: 1.0000 | TOPICAL_OINTMENT | Freq: Two times a day (BID) | CUTANEOUS | 0 refills | Status: AC

## 2024-06-12 MED ORDER — OXYCODONE HCL 5 MG/5ML PO SOLN: 5.0000 mg | Freq: Once | ORAL | Status: DC | PRN

## 2024-06-12 MED ORDER — ONDANSETRON HCL 4 MG/2ML IJ SOLN: 4.0000 mg | Freq: Once | INTRAMUSCULAR | Status: DC | PRN

## 2024-06-12 MED ORDER — MUPIROCIN 2 % EX OINT
1.0000 | TOPICAL_OINTMENT | Freq: Two times a day (BID) | CUTANEOUS | Status: DC
  Administered 2024-06-12 – 2024-06-15 (×7): 1 via NASAL
  Filled 2024-06-12 (×2): qty 22

## 2024-06-12 MED ORDER — DEXAMETHASONE SOD PHOSPHATE PF 10 MG/ML IJ SOLN
INTRAMUSCULAR | Status: DC | PRN
  Administered 2024-06-12: 5 mg via INTRAVENOUS

## 2024-06-12 MED ORDER — LACTATED RINGERS IV SOLN: INTRAVENOUS | Status: DC

## 2024-06-12 MED ORDER — ORAL CARE MOUTH RINSE: 15.0000 mL | Freq: Once | OROMUCOSAL | Status: DC

## 2024-06-12 MED ORDER — VANCOMYCIN HCL IN DEXTROSE 1-5 GM/200ML-% IV SOLN
1000.0000 mg | Freq: Once | INTRAVENOUS | Status: AC
  Administered 2024-06-12: 1000 mg via INTRAVENOUS
  Filled 2024-06-12: qty 200

## 2024-06-12 MED ORDER — LIDOCAINE 2% (20 MG/ML) 5 ML SYRINGE
INTRAMUSCULAR | Status: DC | PRN
  Administered 2024-06-12: 100 mg via INTRAVENOUS

## 2024-06-12 MED ORDER — PHENYLEPHRINE 80 MCG/ML (10ML) SYRINGE FOR IV PUSH (FOR BLOOD PRESSURE SUPPORT)
PREFILLED_SYRINGE | INTRAVENOUS | Status: DC | PRN
  Administered 2024-06-12: 160 ug via INTRAVENOUS
  Administered 2024-06-12: 80 ug via INTRAVENOUS
  Administered 2024-06-12: 120 ug via INTRAVENOUS
  Administered 2024-06-12: 160 ug via INTRAVENOUS
  Administered 2024-06-12 (×2): 120 ug via INTRAVENOUS

## 2024-06-12 MED ORDER — FENTANYL CITRATE (PF) 50 MCG/ML IJ SOSY
25.0000 ug | PREFILLED_SYRINGE | INTRAMUSCULAR | Status: DC | PRN
  Administered 2024-06-12 (×2): 50 ug via INTRAVENOUS
  Filled 2024-06-12 (×2): qty 1

## 2024-06-12 MED ORDER — EPHEDRINE SULFATE (PRESSORS) 25 MG/5ML IV SOSY
PREFILLED_SYRINGE | INTRAVENOUS | Status: DC | PRN
  Administered 2024-06-12 (×2): 5 mg via INTRAVENOUS
  Administered 2024-06-12: 10 mg via INTRAVENOUS

## 2024-06-12 MED ORDER — CHLORHEXIDINE GLUCONATE 4 % EX SOLN: 1.0000 | CUTANEOUS | 1 refills | Status: AC

## 2024-06-12 MED ORDER — FLEET ENEMA RE ENEM: 1.0000 | ENEMA | Freq: Once | RECTAL | Status: DC | PRN

## 2024-06-12 MED ORDER — POVIDONE-IODINE 10 % EX SWAB: 2.0000 | Freq: Once | CUTANEOUS | Status: DC

## 2024-06-12 MED ORDER — ASPIRIN 325 MG PO TBEC
325.0000 mg | DELAYED_RELEASE_TABLET | Freq: Every day | ORAL | Status: DC
  Administered 2024-06-14 – 2024-06-15 (×2): 325 mg via ORAL
  Filled 2024-06-12 (×2): qty 1

## 2024-06-12 MED ORDER — PHENOL 1.4 % MT LIQD: 1.0000 | OROMUCOSAL | Status: DC | PRN

## 2024-06-12 MED ORDER — SODIUM CHLORIDE 0.9 % IR SOLN
Status: DC | PRN
  Administered 2024-06-12: 1000 mL

## 2024-06-12 MED ORDER — CEFAZOLIN SODIUM-DEXTROSE 2-4 GM/100ML-% IV SOLN
2.0000 g | INTRAVENOUS | Status: DC
  Filled 2024-06-12: qty 100

## 2024-06-12 NOTE — Interval H&P Note (Signed)
 History and Physical Interval Note:  06/12/2024 1:32 PM  John Wells  has presented today for surgery, with the diagnosis of right hip fracture.  The various methods of treatment have been discussed with the patient and family. After consideration of risks, benefits and other options for treatment, the patient has consented to  Procedures: FIXATION, FRACTURE, INTERTROCHANTERIC, WITH INTRAMEDULLARY ROD (Right) as a surgical intervention.  The patient's history has been reviewed, patient examined, no change in status, stable for surgery.  I have reviewed the patient's chart and labs.  Questions were answered to the patient's satisfaction.     Taft Minerva
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
## 2024-06-12 NOTE — Op Note (Signed)
 06/12/2024  1:15 PM  3:31 PM  PATIENT:  John Wells  64 y.o. male  PRE-OPERATIVE DIAGNOSIS:  right hip fracture  POST-OPERATIVE DIAGNOSIS:  right hip fracture  PROCEDURE:  Procedures: FIXATION, FRACTURE, INTERTROCHANTERIC, WITH INTRAMEDULLARY ROD (Right)  SURGEON:  Surgeons and Role:    * Margrette Taft BRAVO, MD - Primary  PHYSICIAN ASSISTANT:   ASSISTANTS: none   ANESTHESIA:   general  EBL:  25 mL   BLOOD ADMINISTERED:none  DRAINS: none   LOCAL MEDICATIONS USED:  MARCAINE      SPECIMEN:  No Specimen  DISPOSITION OF SPECIMEN:  N/A  COUNTS:  YES  TOURNIQUET:  * No tourniquets in log *  DICTATION: .Dragon Dictation  PLAN OF CARE: Admit to inpatient   PATIENT DISPOSITION:  PACU - hemodynamically stable.   Delay start of Pharmacological VTE agent (>24hrs) due to surgical blood loss or risk of bleeding: not applicable  Open treatment internal fixation of the hip with CEPHALOMEDULLARY nail  Orthopaedic Surgery Operative Note (CSN: [id number])  John Wells  10-23-1960 Date of Surgery: 06/12/2024    TXA used    Operative Finding Comminuted fracture proximal femur/hip classifies as three-part intertrochanteric fracture displaced    Implants: Implant Name Type Inv. Item Serial No. Manufacturer Lot No. LRB No. Used Action  SCREW LAG GAMMA 3 TI [id number] - [id number] Screw SCREW LAG GAMMA 3 TI [id number]  [employer] [id number] Right 1 Implanted  NAIL TROCH GAMMA 11X18 - [id number] Nail NAIL TROCH GAMMA 11X18  [employer] [id number] Right 1 Implanted  SCREW LOCKING T2 F/T [id number] - [id number] Screw SCREW LOCKING T2 F/T 5MMX40MM  [employer] [id number] Right 1 Implanted   The surgery was done as follows  The patient was reexamined in the preop area the surgical site was confirmed and the right hip was marked with my initials Their images and chart was reviewed as well Implants were checked for availability and were in place  The patient  was taken to the operating room he received general anesthesia he had a Foley catheter inserted and he was placed on the fracture table.  We padded the heels and we padded the perineal post  The right leg was placed in gentle traction the left leg was abducted and externally rotated  The C arm was brought in and images were obtained  The reduction was obtained with traction and internal rotation.  Once the reduction was considered stable the right leg was prepped and draped sterilely  After we did the timeout we confirm that vancomycin  was in place due to the positive nasal swab  The incision was made over the right hip 2 to 3 cm proximal to the trochanter tip.  Subcutaneous tissue divided fascia split finger dissection carried down to bone followed by curved cannulated awl.  Once this was positioned appropriately the guidewire was placed in position down to the knee confirmed with an x-ray.  We overreamed the proximal femur with the manufactures proximal reamer and then passed a 125 degree nail.  We made a second incision and passed the cannula down to bone we placed a threaded tip guidewire into the femoral head slightly inferior on the AP and center center on the lateral.  This measured 107 mm we set the reamer for 100 mm and passed it over the guidewire followed by insertion of the lag screw.  We irrigated the proximal femur and placed the setscrew and then confirmed its engagement by toggling the screwdriver for the lag screw  Once this was done we made a third incision and used the distal locking guide passing cannula down to bone through the incision and then drilling across the bone.  We did have some trouble with the device and had to make an adjustment to get the screw into the distal hole.  This was confirmed however to be in good position by x-ray.  Once it was drilled it was measured off the drill sleeve and then the screw was passed  We irrigated all 3 incisions we closed the proximal  incision with 0 Monocryl and 2-0 Monocryl and then we closed the distal incisions with 2-0 Monocryl in 2 layers   Postoperative plan Weightbearing as tolerated Wound to change dressing after 14 days as needed anticoagulation for 28 days Follow-up visit at 4 weeks for x-rays and then x-rays at 6 weeks and 12 weeks [id number]

## 2024-06-12 NOTE — Progress Notes (Signed)
 Pt down to OR pre-op via stretcher for planned orthopedic surgery.

## 2024-06-12 NOTE — TOC CM/SW Note (Signed)
 Transition of Care [facility]) - Inpatient Brief Assessment   Patient Details  Name: John Wells MRN: [id number] Date of Birth: 09-25-1960  [facility] CM/SW Contact:    Lucie Lunger, LCSWA Phone Number: 06/12/2024, 8:44 AM   Clinical Narrative: CSW notes that pt will go to OR today for surgical repair with Dr. Margrette. Pt will likely need PT eval prior to hospital D/C. TOC to follow for PT eval recommendations.   Transition of Care Asessment: Insurance and Status: Insurance coverage has been reviewed Patient has primary care physician: No (PCP added to AVS) Home environment has been reviewed: From home Prior level of function:: Independent Prior/Current Home Services: No current home services Social Drivers of Health Review: SDOH reviewed no interventions necessary Readmission risk has been reviewed: Yes Transition of care needs: transition of care needs identified, TOC will continue to follow

## 2024-06-12 NOTE — Anesthesia Preprocedure Evaluation (Signed)
"                                    Anesthesia Evaluation  Patient identified by MRN, date of birth, ID band Patient awake    Reviewed: Allergy & Precautions, H&P , NPO status , Patient's Chart, lab work & pertinent test results, reviewed documented beta blocker date and time   Airway Mallampati: II  TM Distance: >3 FB Neck ROM: full    Dental no notable dental hx.    Pulmonary COPD, Current Smoker   Pulmonary exam normal breath sounds clear to auscultation       Cardiovascular Exercise Tolerance: Good hypertension, negative cardio ROS  Rhythm:regular Rate:Normal     Neuro/Psych negative neurological ROS  negative psych ROS   GI/Hepatic negative GI ROS,,,(+) Hepatitis -  Endo/Other  negative endocrine ROS    Renal/GU negative Renal ROS  negative genitourinary   Musculoskeletal   Abdominal   Peds  Hematology negative hematology ROS (+)   Anesthesia Other Findings   Reproductive/Obstetrics negative OB ROS                              Anesthesia Physical Anesthesia Plan  ASA: 2  Anesthesia Plan: General and General LMA   Post-op Pain Management:    Induction:   PONV Risk Score and Plan: Ondansetron   Airway Management Planned:   Additional Equipment:   Intra-op Plan:   Post-operative Plan:   Informed Consent: I have reviewed the patients History and Physical, chart, labs and discussed the procedure including the risks, benefits and alternatives for the proposed anesthesia with the patient or authorized representative who has indicated his/her understanding and acceptance.     Dental Advisory Given  Plan Discussed with: CRNA  Anesthesia Plan Comments:         Anesthesia Quick Evaluation  "
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
## 2024-06-12 NOTE — Transfer of Care (Addendum)
 Immediate Anesthesia Transfer of Care Note  Patient: John Wells  Procedure(s) Performed: FIXATION, FRACTURE, INTERTROCHANTERIC, WITH INTRAMEDULLARY ROD (Right: Hip)  Patient Location: PACU  Anesthesia Type:General  Level of Consciousness: drowsy and patient cooperative  Airway & Oxygen Therapy: Patient Spontanous Breathing and Patient connected to face mask oxygen  Post-op Assessment: Report given to RN and Post -op Vital signs reviewed and stable  Post vital signs: Reviewed and stable  Last Vitals:  Vitals Value Taken Time  BP 123/72 06/12/24   1533  Temp 36.6 06/12/24   1533  Pulse 88 06/12/24   1533  Resp 18 06/12/24   1533  SpO2 100% 06/12/24   1533    Last Pain:  Vitals:   06/12/24 1155  TempSrc: Oral  PainSc: 8       Patients Stated Pain Goal: 6 (06/12/24 1155)  Complications: No notable events documented.

## 2024-06-12 NOTE — Anesthesia Procedure Notes (Signed)
 Procedure Name: LMA Insertion Date/Time: 06/12/2024 1:52 PM  Performed by: Augusta Daved SAILOR, CRNAPre-anesthesia Checklist: Patient identified, Emergency Drugs available, Suction available and Patient being monitored Patient Re-evaluated:Patient Re-evaluated prior to induction Oxygen Delivery Method: Circle System Utilized Preoxygenation: Pre-oxygenation with 100% oxygen Induction Type: IV induction LMA: LMA inserted LMA Size: 4.0 Number of attempts: 1 Placement Confirmation: positive ETCO2 Tube secured with: Tape Dental Injury: Teeth and Oropharynx as per pre-operative assessment

## 2024-06-12 NOTE — Plan of Care (Signed)
" °  Problem: Education: Goal: Knowledge of General Education information will improve Description: Including pain rating scale, medication(s)/side effects and non-pharmacologic comfort measures Outcome: Progressing   Problem: Health Behavior/Discharge Planning: Goal: Ability to manage health-related needs will improve Outcome: Progressing   Problem: Clinical Measurements: Goal: Ability to maintain clinical measurements within normal limits will improve Outcome: Progressing Goal: Will remain free from infection Outcome: Progressing Goal: Diagnostic test results will improve Outcome: Progressing Goal: Respiratory complications will improve Outcome: Progressing Goal: Cardiovascular complication will be avoided Outcome: Progressing   Problem: Activity: Goal: Risk for activity intolerance will decrease Outcome: Progressing   Problem: Nutrition: Goal: Adequate nutrition will be maintained Outcome: Progressing   Problem: Coping: Goal: Level of anxiety will decrease Outcome: Progressing   Problem: Elimination: Goal: Will not experience complications related to bowel motility Outcome: Progressing Goal: Will not experience complications related to urinary retention Outcome: Progressing   Problem: Pain Managment: Goal: General experience of comfort will improve and/or be controlled Outcome: Progressing   Problem: Safety: Goal: Ability to remain free from injury will improve Outcome: Progressing   Problem: Skin Integrity: Goal: Risk for impaired skin integrity will decrease Outcome: Progressing   Problem: Education: Goal: Knowledge of the prescribed therapeutic regimen will improve Outcome: Progressing   Problem: Bowel/Gastric: Goal: Gastrointestinal status for postoperative course will improve Outcome: Progressing   Problem: Cardiac: Goal: Ability to maintain an adequate cardiac output Outcome: Progressing Goal: Will show no evidence of cardiac arrhythmias Outcome:  Progressing   Problem: Nutritional: Goal: Will attain and maintain optimal nutritional status Outcome: Progressing   Problem: Neurological: Goal: Will regain or maintain usual level of consciousness Outcome: Progressing   Problem: Clinical Measurements: Goal: Ability to maintain clinical measurements within normal limits Outcome: Progressing Goal: Postoperative complications will be avoided or minimized Outcome: Progressing   Problem: Respiratory: Goal: Will regain and/or maintain adequate ventilation Outcome: Progressing Goal: Respiratory status will improve Outcome: Progressing   Problem: Skin Integrity: Goal: Demonstrates signs of wound healing without infection Outcome: Progressing   Problem: Urinary Elimination: Goal: Will remain free from infection Outcome: Progressing Goal: Ability to achieve and maintain adequate urine output Outcome: Progressing   Problem: Education: Goal: Verbalization of understanding the information provided (i.e., activity precautions, restrictions, etc) will improve Outcome: Progressing Goal: Individualized Educational Video(s) Outcome: Progressing   Problem: Activity: Goal: Ability to ambulate and perform ADLs will improve Outcome: Progressing   Problem: Clinical Measurements: Goal: Postoperative complications will be avoided or minimized Outcome: Progressing   Problem: Self-Concept: Goal: Ability to maintain and perform role responsibilities to the fullest extent possible will improve Outcome: Progressing   Problem: Pain Management: Goal: Pain level will decrease Outcome: Progressing   "

## 2024-06-12 NOTE — Discharge Instructions (Signed)
 ORTHOPEDIC INSTRUCTIONS FROM DR HARRISON  Postoperative plan Weightbearing as tolerated Wound to change dressing after 14 days as needed anticoagulation for 28 days Follow-up visit at 4 weeks for x-rays and then x-rays at 6 weeks and 12 weeks    Providers Accepting New Patients in [place], [place]    [facility] [address]  [place], [place] [address] [phone] Accepts most insurances  [facility] [address] [place], [place] [address] [phone] Accepts most insurances  [facility] [address] [place], [place] [address]  [phone] Must meet requirements  [facility] [address] [place], [place] [address] [phone] Accepts most insurances  [facility] [address]  [place], [place] [address] [phone] Accepts most insurances  [facility] [address]   [place], [place]   [phone] Accepts most insurances  [facility])  [phone] [address]  [place], [place] [address] [phone] Accepts most insurances     [facility] [address]  [place], [place] [address] [phone] Accepts most insurances  [facility] [address] [place], [place] [address] [phone] option 1 Accepts Medicaid and [facility] Internal Medicine [address]  [place], [place] [address] [phone] Accepts most insurances  Benita Outhouse, MD [address] [place], [place] [address] [phone] Accepts most insurances  [facility] at [facility] [address]  [place], [place] [address] [phone] Accepts most insurances  [facility] [phone] [place], [place] [address] [phone] Accepts most insurances  [place] [place] [address] [place], [place] [address] [phone]  Accepts most insurances

## 2024-06-12 NOTE — Hospital Course (Signed)
 64 y.o. male with medical history significant for chronic pain syndrome, arthritis, COPD, tobacco use, who presents to the ER after a fall on ice.  He was returning home from the [facility] store and as he was walking home he slipped and fell on black ice landing on his right side.  No loss of consciousness.  Not on any blood thinners.  Usual state of health prior to the fall.  EMS was activated and the patient was brought into the ER for further evaluation.   In the ER, vital signs are stable.  Right hip x-ray revealed comminuted and displaced intertrochanteric fracture of the proximal right femur with varus angulation and external rotation of the distal fracture fragment.  Avulsion of the lesser trochanter.   EDP discussed the case with orthopedic surgery, Dr. Margrette.  Plan for orthopedic surgical repair on 06/12/2024.  Pain control in place.  Admitted by [facility], hospitalist service.

## 2024-06-13 DIAGNOSIS — M25551 Pain in right hip: Secondary | ICD-10-CM | POA: Diagnosis not present

## 2024-06-13 DIAGNOSIS — J449 Chronic obstructive pulmonary disease, unspecified: Secondary | ICD-10-CM | POA: Diagnosis not present

## 2024-06-13 DIAGNOSIS — S7291XA Unspecified fracture of right femur, initial encounter for closed fracture: Secondary | ICD-10-CM | POA: Diagnosis not present

## 2024-06-13 DIAGNOSIS — D649 Anemia, unspecified: Secondary | ICD-10-CM | POA: Diagnosis not present

## 2024-06-13 DIAGNOSIS — D62 Acute posthemorrhagic anemia: Secondary | ICD-10-CM | POA: Diagnosis not present

## 2024-06-13 DIAGNOSIS — S7291XD Unspecified fracture of right femur, subsequent encounter for closed fracture with routine healing: Secondary | ICD-10-CM | POA: Diagnosis not present

## 2024-06-13 LAB — BASIC METABOLIC PANEL WITH GFR
Anion gap: 9 (ref 5–15)
BUN: 8 mg/dL (ref 8–23)
CO2: 27 mmol/L (ref 22–32)
Calcium: 8.6 mg/dL — ABNORMAL LOW (ref 8.9–10.3)
Chloride: 104 mmol/L (ref 98–111)
Creatinine, Ser: 0.87 mg/dL (ref 0.61–1.24)
GFR, Estimated: >60 mL/min
Glucose, Bld: 121 mg/dL — ABNORMAL HIGH (ref 70–99)
Potassium: 4.5 mmol/L (ref 3.5–5.1)
Sodium: 140 mmol/L (ref 135–145)

## 2024-06-13 LAB — CBC
HCT: 32.4 % — ABNORMAL LOW (ref 39.0–52.0)
Hemoglobin: 10.6 g/dL — ABNORMAL LOW (ref 13.0–17.0)
MCH: 32.7 pg (ref 26.0–34.0)
MCHC: 32.7 g/dL (ref 30.0–36.0)
MCV: 100 fL (ref 80.0–100.0)
Platelets: 251 10*3/uL (ref 150–400)
RBC: 3.24 MIL/uL — ABNORMAL LOW (ref 4.22–5.81)
RDW: 13.3 % (ref 11.5–15.5)
WBC: 16.7 10*3/uL — ABNORMAL HIGH (ref 4.0–10.5)
nRBC: 0 % (ref 0.0–0.2)

## 2024-06-13 LAB — HIV ANTIBODY (ROUTINE TESTING W REFLEX): HIV Screen 4th Generation wRfx: NONREACTIVE

## 2024-06-13 MED ORDER — PNEUMOCOCCAL 20-VAL CONJ VACC 0.5 ML IM SUSY: 0.5000 mL | PREFILLED_SYRINGE | INTRAMUSCULAR | Status: DC

## 2024-06-13 MED ORDER — PANTOPRAZOLE SODIUM 40 MG PO TBEC
40.0000 mg | DELAYED_RELEASE_TABLET | Freq: Every day | ORAL | Status: DC
  Administered 2024-06-13 – 2024-06-15 (×3): 40 mg via ORAL
  Filled 2024-06-13 (×3): qty 1

## 2024-06-13 NOTE — Evaluation (Signed)
 Physical Therapy Evaluation Patient Details Name: John Wells MRN: [id number] DOB: 1960-09-06 Today's Date: 06/13/2024  History of Present Illness  John Wells is a 64 y.o. male s/p FIXATION, FRACTURE, INTERTROCHANTERIC, WITH INTRAMEDULLARY ROD (Right) on 06/12/24 with  medical history significant for chronic pain syndrome, arthritis, COPD, tobacco use, who presents to the ER after a fall on ice.  He was returning home from the [facility] store and as he was walking home he slipped and fell on black ice landing on his right side.  No loss of consciousness.  Not on any blood thinners.  Usual state of health prior to the fall.  EMS was activated and the patient was brought into the ER for further evaluation.     In the ER, vital signs are stable.  Right hip x-ray revealed comminuted and displaced intertrochanteric fracture of the proximal right femur with varus angulation and external rotation of the distal fracture fragment.  Avulsion of the lesser trochanter.     EDP discussed the case with orthopedic surgery, Dr. Margrette.  Plan for orthopedic surgical repair on 06/12/2024.  Pain control in place.  Admitted by [facility], hospitalist service.   Clinical Impression  Patient demonstrates slow labored movement for sitting up at bedside requiring frequent rest breaks due to increasing right hip pain. Patient demonstrates slow labored movement with poor carryover for right heel to toe stepping and putting bodyweight on RLE due to increasing pain. Patient tolerated sitting up in chair after therapy. Patient will benefit from continued skilled physical therapy in hospital and recommended venue below to increase strength, balance, endurance for safe ADLs and gait.           If plan is discharge home, recommend the following: A lot of help with bathing/dressing/bathroom;A lot of help with walking and/or transfers;Help with stairs or ramp for entrance;Assist for transportation;Assistance with cooking/housework    Can travel by private vehicle   Yes    Equipment Recommendations Rolling walker (2 wheels)  Recommendations for Other Services       Functional Status Assessment Patient has had a recent decline in their functional status and demonstrates the ability to make significant improvements in function in a reasonable and predictable amount of time.     Precautions / Restrictions Precautions Precautions: Fall Recall of Precautions/Restrictions: Intact Restrictions Weight Bearing Restrictions Per Provider Order: Yes RLE Weight Bearing Per Provider Order: Weight bearing as tolerated      Mobility  Bed Mobility Overal bed mobility: Needs Assistance Bed Mobility: Supine to Sit     Supine to sit: Min assist, Mod assist     General bed mobility comments: increased time, labored movement, frequent rest breaks due to increasing right hip pain    Transfers Overall transfer level: Needs assistance Equipment used: Rolling walker (2 wheels) Transfers: Sit to/from Stand, Bed to chair/wheelchair/BSC Sit to Stand: Min assist   Step pivot transfers: Min assist       General transfer comment: slow labored movement    Ambulation/Gait Ambulation/Gait assistance: Min assist, Mod assist Gait Distance (Feet): 20 Feet Assistive device: Rolling walker (2 wheels) Gait Pattern/deviations: Decreased step length - right, Decreased step length - left, Decreased stride length Gait velocity: decreased     General Gait Details: slow labored movement with limited weightbearing on RLE due to increasing hip pain, limied mostly due to fatigue and right hip pain  Stairs            Wheelchair Mobility  Tilt Bed    Modified Rankin (Stroke Patients Only)       Balance Overall balance assessment: Needs assistance Sitting-balance support: Feet supported, No upper extremity supported Sitting balance-Leahy Scale: Fair Sitting balance - Comments: fair/good seated at EOB   Standing  balance support: Reliant on assistive device for balance, During functional activity, Bilateral upper extremity supported Standing balance-Leahy Scale: Poor Standing balance comment: fair/poor using RW                             Pertinent Vitals/Pain Pain Assessment Pain Assessment: Faces Faces Pain Scale: Hurts little more Pain Location: right hip with movement Pain Descriptors / Indicators: Discomfort, Grimacing, Sore Pain Intervention(s): Limited activity within patient's tolerance, Monitored during session, Repositioned    Home Living Family/patient expects to be discharged to:: Private residence Living Arrangements: Other relatives Available Help at Discharge: Family;Available 24 hours/day Type of Home: Apartment Home Access: Stairs to enter Entrance Stairs-Rails: Right Entrance Stairs-Number of Steps: 2   Home Layout: One level Home Equipment: Cane - single point      Prior Function Prior Level of Function : Independent/Modified Independent;Driving             Mobility Comments: household and community ambulation using SPC PRN ADLs Comments: Independent     Extremity/Trunk Assessment   Upper Extremity Assessment Upper Extremity Assessment: Generalized weakness    Lower Extremity Assessment Lower Extremity Assessment: Generalized weakness;RLE deficits/detail RLE Deficits / Details: grossly -4/5 RLE: Unable to fully assess due to pain RLE Sensation: WNL RLE Coordination: WNL    Cervical / Trunk Assessment Cervical / Trunk Assessment: Normal  Communication   Communication Communication: No apparent difficulties    Cognition Arousal: Alert Behavior During Therapy: WFL for tasks assessed/performed   PT - Cognitive impairments: No apparent impairments, Sequencing                         Following commands: Intact       Cueing Cueing Techniques: Verbal cues, Tactile cues     General Comments      Exercises      Assessment/Plan    PT Assessment Patient needs continued PT services  PT Problem List Decreased strength;Decreased activity tolerance;Decreased balance;Decreased mobility       PT Treatment Interventions DME instruction;Gait training;Stair training;Functional mobility training;Therapeutic activities;Therapeutic exercise;Balance training;Patient/family education    PT Goals (Current goals can be found in the Care Plan section)  Acute Rehab PT Goals Patient Stated Goal: return home after rehab PT Goal Formulation: With patient Time For Goal Achievement: 06/27/24 Potential to Achieve Goals: Good    Frequency Min 4X/week     Co-evaluation               AM-PAC PT 6 Clicks Mobility  Outcome Measure Help needed turning from your back to your side while in a flat bed without using bedrails?: A Lot Help needed moving from lying on your back to sitting on the side of a flat bed without using bedrails?: A Lot Help needed moving to and from a bed to a chair (including a wheelchair)?: A Lot Help needed standing up from a chair using your arms (e.g., wheelchair or bedside chair)?: A Little Help needed to walk in hospital room?: A Lot Help needed climbing 3-5 steps with a railing? : A Lot 6 Click Score: 13    End of Session   Activity Tolerance: Patient  tolerated treatment well;Patient limited by fatigue;Patient limited by pain Patient left: in chair;with call bell/phone within reach Nurse Communication: Mobility status PT Visit Diagnosis: Unsteadiness on feet (R26.81);Other abnormalities of gait and mobility (R26.89);Muscle weakness (generalized) (M62.81)    Time: 9053-8991 PT Time Calculation (min) (ACUTE ONLY): 22 min   Charges:   PT Evaluation $PT Eval Moderate Complexity: 1 Mod PT Treatments $Therapeutic Activity: 8-22 mins PT General Charges $$ ACUTE PT VISIT: 1 Visit         2:04 PM, 06/13/24 Lynwood Music, MPT Physical Therapist with [facility] 336 [phone] office [phone] mobile phone

## 2024-06-13 NOTE — Anesthesia Postprocedure Evaluation (Signed)
"   Anesthesia Post Note  Patient: John Wells  Procedure(s) Performed: FIXATION, FRACTURE, INTERTROCHANTERIC, WITH INTRAMEDULLARY ROD (Right: Hip)  Patient location during evaluation: Phase II Anesthesia Type: General Level of consciousness: awake Pain management: pain level controlled Vital Signs Assessment: post-procedure vital signs reviewed and stable Respiratory status: spontaneous breathing and respiratory function stable Cardiovascular status: blood pressure returned to baseline and stable Postop Assessment: no headache and no apparent nausea or vomiting Anesthetic complications: no Comments: Late entry   No notable events documented.   Last Vitals:  Vitals:   06/13/24 0513 06/13/24 0847  BP: 110/61   Pulse: 71   Resp: 18   Temp: 37.1 C   SpO2: 95% 95%    Last Pain:  Vitals:   06/13/24 1006  TempSrc:   PainSc: 5                  Yvonna PARAS Marieelena Bartko      "

## 2024-06-13 NOTE — Progress Notes (Signed)
 " PROGRESS NOTE   KEIGEN CADDELL  FMW:[id number] DOB: Dec 07, 1960 DOA: 06/11/2024 PCP: No primary care provider on file.   Chief Complaint  Patient presents with   Fall   Level of care: Med-Surg  Brief Admission History:  64 y.o. male with medical history significant for chronic pain syndrome, arthritis, COPD, tobacco use, who presents to the ER after a fall on ice.  He was returning home from the [facility] store and as he was walking home he slipped and fell on black ice landing on his right side.  No loss of consciousness.  Not on any blood thinners.  Usual state of health prior to the fall.  EMS was activated and the patient was brought into the ER for further evaluation.   In the ER, vital signs are stable.  Right hip x-ray revealed comminuted and displaced intertrochanteric fracture of the proximal right femur with varus angulation and external rotation of the distal fracture fragment.  Avulsion of the lesser trochanter.   EDP discussed the case with orthopedic surgery, Dr. Margrette.  Plan for orthopedic surgical repair on 06/12/2024.  Pain control in place.  Admitted by [facility], hospitalist service.   Assessment and Plan:  Right femur fracture Seen on x-ray: Comminuted and displaced intertrochanteric fracture of the proximal right femur with varus angulation and external rotation of the distal fracture fragment.  Avulsion of the lesser trochanter. Postop s/p orthopedic surgical repair on 06/12/2024 by Dr. Margrette. Continue postop care and pain control Regular diet DVT prophylaxis per ortho aspirin  325 mg daily PPI for GI protection   Anemia Hg 10.2 likely hemodilution from fluids Recheck CBC in AM     Leukocytosis, suspect reactive in the setting of fracture and surgery Usual state of health prior to the fall. Afebrile and nontoxic-appearing Check CBC in AM with diff    Tobacco use disorder Nicotine  patch 14 mg daily.   COPD No acute issues Maintain O2 saturation above  90% DuoNebs every 2 hours as needed for wheezing and shortness of breath.  DVT prophylaxis: SCDs Code Status: Full  Family Communication:  Disposition: TBD  Consultants:  Orthopedics   Procedures:  FIXATION, FRACTURE, INTERTROCHANTERIC, WITH INTRAMEDULLARY ROD (Right)  06/12/24   Antimicrobials:    Subjective: Pt reports his pain is minimal and controlled.     Objective: Vitals:   06/12/24 1636 06/12/24 2104 06/13/24 0513 06/13/24 0847  BP: (!) 153/84 110/76 110/61   Pulse: 82 86 71   Resp: 16 20 18    Temp: 97.8 F (36.6 C) 99.2 F (37.3 C) 98.8 F (37.1 C)   TempSrc: Oral Oral Oral   SpO2: 93% 97% 95% 95%  Weight:      Height:        Intake/Output Summary (Last 24 hours) at 06/13/2024 1115 Last data filed at 06/13/2024 9366 Gross per 24 hour  Intake 1440 ml  Output 2610 ml  Net -1170 ml   Filed Weights   06/11/24 2053 06/12/24 1155  Weight: 63.5 kg 63.5 kg   Examination:  General exam: Appears calm and comfortable  Respiratory system: Clear to auscultation. Respiratory effort normal. Cardiovascular system: normal S1 & S2 heard. No JVD, murmurs, rubs, gallops or clicks. No pedal edema. Gastrointestinal system: Abdomen is nondistended, soft and nontender. No organomegaly or masses felt. Normal bowel sounds heard. Central nervous system: Alert and oriented. No focal neurological deficits. Extremities: bandages clean and dry; distal pedal pulses palpated bilateral LEs.  Skin: No rashes, lesions or ulcers. Psychiatry:  Judgement and insight appear normal. Mood & affect appropriate.   Data Reviewed: I have personally reviewed following labs and imaging studies  CBC: Recent Labs  Lab 06/11/24 2200 06/12/24 0435 06/13/24 0412  WBC 14.0* 13.8* 16.7*  NEUTROABS 10.8*  --   --   HGB 14.2 12.5* 10.6*  HCT 43.5 37.9* 32.4*  MCV 98.0 98.4 100.0  PLT 308 286 251    Basic Metabolic Panel: Recent Labs  Lab 06/11/24 2200 06/12/24 0435 06/13/24 0412  NA 139  137 140  K 4.2 4.0 4.5  CL 99 101 104  CO2 29 24 27   GLUCOSE 85 112* 121*  BUN 10 9 8   CREATININE 0.89 0.77 0.87  CALCIUM 9.3 8.7* 8.6*  MG  --  2.0  --   PHOS  --  3.5  --     CBG: No results for input(s): GLUCAP in the last 168 hours.  Recent Results (from the past 240 hours)  Surgical pcr screen     Status: Abnormal   Collection Time: 06/12/24  1:58 AM   Specimen: Nasal Mucosa; Nasal Swab  Result Value Ref Range Status   MRSA, PCR POSITIVE (A) NEGATIVE Final    Comment: RESULT CALLED TO, READ BACK BY AND VERIFIED WITH: L BULLINS,RN@0551  06/12/24 MK    Staphylococcus aureus POSITIVE (A) NEGATIVE Final    Comment: (NOTE) The Xpert SA Assay (FDA approved for NASAL specimens in patients 64 years of age and older), is one component of a comprehensive surveillance program. It is not intended to diagnose infection nor to guide or monitor treatment. Performed at [facility], [address] [place], [place] [address]      Radiology Studies: DG HIP UNILAT WITH PELVIS 2-3 VIEWS RIGHT Result Date: 06/12/2024 CLINICAL DATA:  Fracture, postop. EXAM: DG HIP (WITH OR WITHOUT PELVIS) 2-3V RIGHT COMPARISON:  Preoperative imaging FINDINGS: Femoral intramedullary nail with trans trochanteric and distal locking screw fixation of proximal femur fracture. Decreased angulation from preoperative imaging. Recent postsurgical change includes air and edema in the soft tissues. IMPRESSION: ORIF of proximal femur fracture, in improved alignment from preoperative imaging. Electronically Signed   By: Andrea Gasman M.D.   On: 06/12/2024 16:41   DG HIP UNILAT WITH PELVIS 2-3 VIEWS RIGHT Result Date: 06/12/2024 CLINICAL DATA:  Open right hip fracture. EXAM: DG HIP (WITH OR WITHOUT PELVIS) 2-3V RIGHT COMPARISON:  Preoperative imaging FINDINGS: Seven fluoroscopic spot views of the hip submitted from the operating room. Imaging obtained during femoral intramedullary nail with trans trochanteric and distal  locking screw fixation of proximal femur fracture. Fluoroscopy time 2 minutes 22 seconds. Dose 20.7 mGy. IMPRESSION: Intraoperative fluoroscopy during proximal femur fracture ORIF. Electronically Signed   By: Andrea Gasman M.D.   On: 06/12/2024 16:40   DG C-Arm 1-60 Min-No Report Result Date: 06/12/2024 Fluoroscopy was utilized by the requesting physician.  No radiographic interpretation.   DG FEMUR PORT, 1V RIGHT Result Date: 06/11/2024 EXAM: 1 VIEW XRAY OF THE RIGHT FEMUR 06/11/2024 09:34:00 PM COMPARISON: None available. CLINICAL HISTORY: Fall. FINDINGS: BONES AND JOINTS: Comminuted and displaced intertrochanteric fracture of the proximal right femur. Varus angulation and external rotation of the distal fracture fragment. Avulsion of the lesser trochanter. SOFT TISSUES: Unremarkable. IMPRESSION: 1. Comminuted and displaced intertrochanteric fracture of the proximal right femur with varus angulation and external rotation of the distal fracture fragment. 2. Avulsion of the lesser trochanter. Electronically signed by: Dorethia Molt MD 06/11/2024 09:43 PM EST RP Workstation: [id number]   DG Knee  Right Port Result Date: 06/11/2024 EXAM: 1-2 VIEW(S) XRAY OF THE KNEE 06/11/2024 09:34:00 PM COMPARISON: None available. CLINICAL HISTORY: Fall. FINDINGS: BONES AND JOINTS: No acute fracture. No malalignment. No significant joint effusion. SOFT TISSUES: Vascular calcifications. IMPRESSION: 1. No evidence of acute traumatic injury. 2. Vascular calcifications. Electronically signed by: Dorethia Molt MD 06/11/2024 09:42 PM EST RP Workstation: [id number]   DG Hip Unilat  With Pelvis 2-3 Views Right Result Date: 06/11/2024 EXAM: 2-3 VIEW(S) XRAY OF THE PELVIS AND RIGHT HIP 06/11/2024 09:34:00 PM COMPARISON: None available. CLINICAL HISTORY: Fall. FINDINGS: BONES AND JOINTS: SI joints are symmetric. Acute comminuted right intertrochanteric fracture with mild impaction and varus angulation at fracture site. Avulsion  of the lesser trochanter. The left hip demonstrates normal alignment. SOFT TISSUES: Vascular calcifications. IMPRESSION: 1. Acute comminuted right intertrochanteric fracture with mild impaction and varus angulation at the fracture site, with avulsion of the lesser trochanter. 2. Vascular calcifications. Electronically signed by: Dorethia Molt MD 06/11/2024 09:42 PM EST RP Workstation: [id number]    Scheduled Meds:  [START ON 06/14/2024] aspirin  EC  325 mg Oral Daily   Chlorhexidine  Gluconate Cloth  6 each Topical Daily   fluticasone  furoate-vilanterol  1 puff Inhalation Daily   mupirocin  ointment  1 Application Nasal BID   nicotine   14 mg Transdermal Daily   pantoprazole   40 mg Oral Q0600   pneumococcal 20-valent conjugate vaccine  0.5 mL Intramuscular Tomorrow-1000   senna  1 tablet Oral BID   Continuous Infusions:  sodium chloride  125 mL/hr at 06/13/24 0359    LOS: 1 day   Time spent: 53 mins  Euleta Belson Vicci, MD How to contact the [facility] Attending or Consulting provider 7A - 7P or covering provider during after hours 7P -7A, for this patient?  Check the care team in [facility] and look for a) attending/consulting TRH provider listed and b) the TRH team listed Log into [email or web address] to find provider on call.  Locate the TRH provider you are looking for under [facility] and page to a number that you can be directly reached. If you still have difficulty reaching the provider, please page the [facility] (Director on Call) for the Hospitalists listed on amion for assistance.  06/13/2024, 11:15 AM    "

## 2024-06-13 NOTE — Plan of Care (Signed)
" °  Problem: Acute Rehab PT Goals(only PT should resolve) Goal: Pt Will Go Supine/Side To Sit Outcome: Progressing Flowsheets (Taken 06/13/2024 1405) Pt will go Supine/Side to Sit:  with minimal assist  with contact guard assist Goal: Patient Will Transfer Sit To/From Stand Outcome: Progressing Flowsheets (Taken 06/13/2024 1405) Patient will transfer sit to/from stand:  with contact guard assist  with minimal assist Goal: Pt Will Transfer Bed To Chair/Chair To Bed Outcome: Progressing Flowsheets (Taken 06/13/2024 1405) Pt will Transfer Bed to Chair/Chair to Bed:  with contact guard assist  with min assist Goal: Pt Will Ambulate Outcome: Progressing Flowsheets (Taken 06/13/2024 1405) Pt will Ambulate:  50 feet  with minimal assist  with contact guard assist  with rolling walker   2:06 PM, 06/13/24 Lynwood Music, MPT Physical Therapist with [facility] 336 [phone] office [phone] mobile phone  "

## 2024-06-13 NOTE — Plan of Care (Signed)

## 2024-06-14 ENCOUNTER — Inpatient Hospital Stay (HOSPITAL_COMMUNITY)

## 2024-06-14 DIAGNOSIS — D62 Acute posthemorrhagic anemia: Secondary | ICD-10-CM

## 2024-06-14 DIAGNOSIS — M25551 Pain in right hip: Secondary | ICD-10-CM

## 2024-06-14 DIAGNOSIS — S7291XA Unspecified fracture of right femur, initial encounter for closed fracture: Secondary | ICD-10-CM | POA: Diagnosis not present

## 2024-06-14 LAB — CBC WITH DIFFERENTIAL/PLATELET
Abs Immature Granulocytes: 0.04 10*3/uL (ref 0.00–0.07)
Basophils Absolute: 0.1 10*3/uL (ref 0.0–0.1)
Basophils Relative: 1 %
Eosinophils Absolute: 0.4 10*3/uL (ref 0.0–0.5)
Eosinophils Relative: 3 %
HCT: 30.7 % — ABNORMAL LOW (ref 39.0–52.0)
Hemoglobin: 10 g/dL — ABNORMAL LOW (ref 13.0–17.0)
Immature Granulocytes: 0 %
Lymphocytes Relative: 19 %
Lymphs Abs: 2.5 10*3/uL (ref 0.7–4.0)
MCH: 32.4 pg (ref 26.0–34.0)
MCHC: 32.6 g/dL (ref 30.0–36.0)
MCV: 99.4 fL (ref 80.0–100.0)
Monocytes Absolute: 1.5 10*3/uL — ABNORMAL HIGH (ref 0.1–1.0)
Monocytes Relative: 12 %
Neutro Abs: 8.3 10*3/uL — ABNORMAL HIGH (ref 1.7–7.7)
Neutrophils Relative %: 65 %
Platelets: 258 10*3/uL (ref 150–400)
RBC: 3.09 MIL/uL — ABNORMAL LOW (ref 4.22–5.81)
RDW: 13.4 % (ref 11.5–15.5)
WBC: 12.7 10*3/uL — ABNORMAL HIGH (ref 4.0–10.5)
nRBC: 0 % (ref 0.0–0.2)

## 2024-06-14 LAB — BASIC METABOLIC PANEL WITH GFR
Anion gap: 10 (ref 5–15)
BUN: 7 mg/dL — ABNORMAL LOW (ref 8–23)
CO2: 26 mmol/L (ref 22–32)
Calcium: 8.6 mg/dL — ABNORMAL LOW (ref 8.9–10.3)
Chloride: 102 mmol/L (ref 98–111)
Creatinine, Ser: 0.8 mg/dL (ref 0.61–1.24)
GFR, Estimated: >60 mL/min
Glucose, Bld: 104 mg/dL — ABNORMAL HIGH (ref 70–99)
Potassium: 4.1 mmol/L (ref 3.5–5.1)
Sodium: 138 mmol/L (ref 135–145)

## 2024-06-14 MED ORDER — OXYCODONE HCL 5 MG PO TABS
5.0000 mg | ORAL_TABLET | ORAL | Status: DC | PRN
  Administered 2024-06-14 – 2024-06-15 (×5): 7.5 mg via ORAL
  Filled 2024-06-14 (×5): qty 2

## 2024-06-14 MED ORDER — OXYCODONE HCL 5 MG PO TABS: 5.0000 mg | ORAL_TABLET | ORAL | Status: DC | PRN

## 2024-06-14 NOTE — Plan of Care (Signed)
" °  Problem: Education: Goal: Knowledge of General Education information will improve Description: Including pain rating scale, medication(s)/side effects and non-pharmacologic comfort measures Outcome: Progressing   Problem: Health Behavior/Discharge Planning: Goal: Ability to manage health-related needs will improve Outcome: Progressing   Problem: Clinical Measurements: Goal: Ability to maintain clinical measurements within normal limits will improve Outcome: Progressing Goal: Will remain free from infection Outcome: Progressing Goal: Diagnostic test results will improve Outcome: Progressing Goal: Respiratory complications will improve Outcome: Progressing Goal: Cardiovascular complication will be avoided Outcome: Progressing   Problem: Activity: Goal: Risk for activity intolerance will decrease Outcome: Progressing   Problem: Nutrition: Goal: Adequate nutrition will be maintained Outcome: Progressing   Problem: Coping: Goal: Level of anxiety will decrease Outcome: Progressing   Problem: Elimination: Goal: Will not experience complications related to bowel motility Outcome: Progressing Goal: Will not experience complications related to urinary retention Outcome: Progressing   Problem: Pain Managment: Goal: General experience of comfort will improve and/or be controlled Outcome: Progressing   Problem: Safety: Goal: Ability to remain free from injury will improve Outcome: Progressing   Problem: Skin Integrity: Goal: Risk for impaired skin integrity will decrease Outcome: Progressing   Problem: Education: Goal: Knowledge of the prescribed therapeutic regimen will improve Outcome: Progressing   Problem: Bowel/Gastric: Goal: Gastrointestinal status for postoperative course will improve Outcome: Progressing   Problem: Cardiac: Goal: Ability to maintain an adequate cardiac output Outcome: Progressing Goal: Will show no evidence of cardiac arrhythmias Outcome:  Progressing   Problem: Nutritional: Goal: Will attain and maintain optimal nutritional status Outcome: Progressing   Problem: Neurological: Goal: Will regain or maintain usual level of consciousness Outcome: Progressing   Problem: Clinical Measurements: Goal: Ability to maintain clinical measurements within normal limits Outcome: Progressing Goal: Postoperative complications will be avoided or minimized Outcome: Progressing   Problem: Respiratory: Goal: Will regain and/or maintain adequate ventilation Outcome: Progressing Goal: Respiratory status will improve Outcome: Progressing   Problem: Skin Integrity: Goal: Demonstrates signs of wound healing without infection Outcome: Progressing   Problem: Urinary Elimination: Goal: Will remain free from infection Outcome: Progressing Goal: Ability to achieve and maintain adequate urine output Outcome: Progressing   Problem: Education: Goal: Verbalization of understanding the information provided (i.e., activity precautions, restrictions, etc) will improve Outcome: Progressing Goal: Individualized Educational Video(s) Outcome: Progressing   Problem: Activity: Goal: Ability to ambulate and perform ADLs will improve Outcome: Progressing   Problem: Clinical Measurements: Goal: Postoperative complications will be avoided or minimized Outcome: Progressing   Problem: Self-Concept: Goal: Ability to maintain and perform role responsibilities to the fullest extent possible will improve Outcome: Progressing   Problem: Pain Management: Goal: Pain level will decrease Outcome: Progressing   "

## 2024-06-14 NOTE — Progress Notes (Signed)
 " PROGRESS NOTE   John Wells  FMW:[id number] DOB: 1960/06/21 DOA: 06/11/2024 PCP: No primary care provider on file.   Chief Complaint  Patient presents with   Fall   Level of care: Med-Surg  Brief Admission History:  64 y.o. male with medical history significant for chronic pain syndrome, arthritis, COPD, tobacco use, who presents to the ER after a fall on ice.  He was returning home from the [employer] store and as he was walking home he slipped and fell on black ice landing on his right side.  No loss of consciousness.  Not on any blood thinners.  Usual state of health prior to the fall.  EMS was activated and the patient was brought into the ER for further evaluation.   In the ER, vital signs are stable.  Right hip x-ray revealed comminuted and displaced intertrochanteric fracture of the proximal right femur with varus angulation and external rotation of the distal fracture fragment.  Avulsion of the lesser trochanter.   EDP discussed the case with orthopedic surgery, Dr. Margrette.  Plan for orthopedic surgical repair on 06/12/2024.  Pain control in place.  Admitted by [facility], hospitalist service.   Assessment and Plan:  Right femur fracture Seen on x-ray: Comminuted and displaced intertrochanteric fracture of the proximal right femur with varus angulation and external rotation of the distal fracture fragment.  Avulsion of the lesser trochanter. Postop s/p orthopedic surgical repair on 06/12/2024 by Dr. Margrette. Continue postop care and pain control Regular diet DVT prophylaxis per ortho aspirin  325 mg daily PPI for GI protection  Pt has been threatening to leave AMA  Anemia Hg 10.2 likely hemodilution from fluids Recheck Hg 10.0      Leukocytosis, suspect reactive in the setting of fracture and surgery Usual state of health prior to the fall. Afebrile and nontoxic-appearing Check CBC in AM with diff    Tobacco use disorder Nicotine  patch 14 mg daily.   COPD No acute  issues Maintain O2 saturation above 90% DuoNebs every 2 hours as needed for wheezing and shortness of breath.  DVT prophylaxis: aspirin  325 mg / SCDs Code Status: Full  Family Communication:  Disposition: TBD  Consultants:  Orthopedics   Procedures:  FIXATION, FRACTURE, INTERTROCHANTERIC, WITH INTRAMEDULLARY ROD (Right)  06/12/24   Antimicrobials:    Subjective: Pt says he needs more pain medication.     Objective: Vitals:   06/13/24 1409 06/13/24 2118 06/14/24 0538 06/14/24 0726  BP: 138/83 125/66 123/73   Pulse: 87 87 83   Resp: 18 17 18    Temp: 98.4 F (36.9 C) 98.9 F (37.2 C) 98.5 F (36.9 C)   TempSrc: Oral Oral Oral   SpO2: 98% 98% 94% 95%  Weight:      Height:        Intake/Output Summary (Last 24 hours) at 06/14/2024 1205 Last data filed at 06/14/2024 9375 Gross per 24 hour  Intake 480 ml  Output 1050 ml  Net -570 ml   Filed Weights   06/11/24 2053 06/12/24 1155  Weight: 63.5 kg 63.5 kg   Examination:  General exam: Appears calm and comfortable  Respiratory system: Clear to auscultation. Respiratory effort normal. Cardiovascular system: normal S1 & S2 heard. No JVD, murmurs, rubs, gallops or clicks. No pedal edema. Gastrointestinal system: Abdomen is nondistended, soft and nontender. No organomegaly or masses felt. Normal bowel sounds heard. Central nervous system: Alert and oriented. No focal neurological deficits. Extremities: bandages clean and dry; distal pedal pulses palpated bilateral  LEs.  Skin: No rashes, lesions or ulcers. Psychiatry: Judgement and insight appear normal. Mood & affect appropriate.   Data Reviewed: I have personally reviewed following labs and imaging studies  CBC: Recent Labs  Lab 06/11/24 2200 06/12/24 0435 06/13/24 0412 06/14/24 0411  WBC 14.0* 13.8* 16.7* 12.7*  NEUTROABS 10.8*  --   --  8.3*  HGB 14.2 12.5* 10.6* 10.0*  HCT 43.5 37.9* 32.4* 30.7*  MCV 98.0 98.4 100.0 99.4  PLT 308 286 251 258    Basic  Metabolic Panel: Recent Labs  Lab 06/11/24 2200 06/12/24 0435 06/13/24 0412 06/14/24 0411  NA 139 137 140 138  K 4.2 4.0 4.5 4.1  CL 99 101 104 102  CO2 29 24 27 26   GLUCOSE 85 112* 121* 104*  BUN 10 9 8  7*  CREATININE 0.89 0.77 0.87 0.80  CALCIUM 9.3 8.7* 8.6* 8.6*  MG  --  2.0  --   --   PHOS  --  3.5  --   --     CBG: No results for input(s): GLUCAP in the last 168 hours.  Recent Results (from the past 240 hours)  Surgical pcr screen     Status: Abnormal   Collection Time: 06/12/24  1:58 AM   Specimen: Nasal Mucosa; Nasal Swab  Result Value Ref Range Status   MRSA, PCR POSITIVE (A) NEGATIVE Final    Comment: RESULT CALLED TO, READ BACK BY AND VERIFIED WITH: L BULLINS,RN@0551  06/12/24 MK    Staphylococcus aureus POSITIVE (A) NEGATIVE Final    Comment: (NOTE) The Xpert SA Assay (FDA approved for NASAL specimens in patients 80 years of age and older), is one component of a comprehensive surveillance program. It is not intended to diagnose infection nor to guide or monitor treatment. Performed at [facility], [address] [place], [place] [address]      Radiology Studies: DG CHEST PORT 1 VIEW Result Date: 06/14/2024 CLINICAL DATA:  [id number] Chest congestion [id number] EXAM: PORTABLE CHEST 1 VIEW COMPARISON:  March 30, 2011 FINDINGS: The cardiomediastinal silhouette is unchanged in contour. No pleural effusion. No pneumothorax. No acute pleuroparenchymal abnormality. Remote LEFT clavicular fracture. Remote rib fractures. IMPRESSION: No acute cardiopulmonary abnormality. Given smoking history, recommend evaluation for candidacy for annual lung cancer screening CT. Electronically Signed   By: Corean Salter M.D.   On: 06/14/2024 09:21   DG HIP UNILAT WITH PELVIS 2-3 VIEWS RIGHT Result Date: 06/12/2024 CLINICAL DATA:  Fracture, postop. EXAM: DG HIP (WITH OR WITHOUT PELVIS) 2-3V RIGHT COMPARISON:  Preoperative imaging FINDINGS: Femoral intramedullary nail with trans  trochanteric and distal locking screw fixation of proximal femur fracture. Decreased angulation from preoperative imaging. Recent postsurgical change includes air and edema in the soft tissues. IMPRESSION: ORIF of proximal femur fracture, in improved alignment from preoperative imaging. Electronically Signed   By: Andrea Gasman M.D.   On: 06/12/2024 16:41   DG HIP UNILAT WITH PELVIS 2-3 VIEWS RIGHT Result Date: 06/12/2024 CLINICAL DATA:  Open right hip fracture. EXAM: DG HIP (WITH OR WITHOUT PELVIS) 2-3V RIGHT COMPARISON:  Preoperative imaging FINDINGS: Seven fluoroscopic spot views of the hip submitted from the operating room. Imaging obtained during femoral intramedullary nail with trans trochanteric and distal locking screw fixation of proximal femur fracture. Fluoroscopy time 2 minutes 22 seconds. Dose 20.7 mGy. IMPRESSION: Intraoperative fluoroscopy during proximal femur fracture ORIF. Electronically Signed   By: Andrea Gasman M.D.   On: 06/12/2024 16:40   DG C-Arm 1-60 Min-No Report Result Date: 06/12/2024  Fluoroscopy was utilized by the requesting physician.  No radiographic interpretation.    Scheduled Meds:  aspirin  EC  325 mg Oral Daily   Chlorhexidine  Gluconate Cloth  6 each Topical Daily   fluticasone  furoate-vilanterol  1 puff Inhalation Daily   mupirocin  ointment  1 Application Nasal BID   nicotine   14 mg Transdermal Daily   pantoprazole   40 mg Oral Q0600   pneumococcal 20-valent conjugate vaccine  0.5 mL Intramuscular Tomorrow-1000   senna  1 tablet Oral BID   Continuous Infusions:   LOS: 3 days   Time spent: 50 mins  Marland Reine Vicci, MD How to contact the [facility] Attending or Consulting provider 7A - 7P or covering provider during after hours 7P -7A, for this patient?  Check the care team in [facility] and look for a) attending/consulting TRH provider listed and b) the TRH team listed Log into [email or web address] to find provider on call.  Locate the TRH provider you are looking  for under [facility] and page to a number that you can be directly reached. If you still have difficulty reaching the provider, please page the [facility] (Director on Call) for the Hospitalists listed on amion for assistance.  06/14/2024, 12:05 PM    "

## 2024-06-14 NOTE — Plan of Care (Signed)

## 2024-06-15 ENCOUNTER — Encounter (HOSPITAL_COMMUNITY): Payer: Self-pay | Admitting: Orthopedic Surgery

## 2024-06-15 DIAGNOSIS — D649 Anemia, unspecified: Secondary | ICD-10-CM | POA: Diagnosis not present

## 2024-06-15 DIAGNOSIS — J449 Chronic obstructive pulmonary disease, unspecified: Secondary | ICD-10-CM | POA: Diagnosis not present

## 2024-06-15 DIAGNOSIS — S7291XD Unspecified fracture of right femur, subsequent encounter for closed fracture with routine healing: Secondary | ICD-10-CM | POA: Diagnosis not present

## 2024-06-15 MED ORDER — SENNA 8.6 MG PO TABS: 1.0000 | ORAL_TABLET | Freq: Two times a day (BID) | ORAL | 0 refills | Status: AC

## 2024-06-15 MED ORDER — OXYCODONE HCL 5 MG PO TABS: 5.0000 mg | ORAL_TABLET | Freq: Four times a day (QID) | ORAL | 0 refills | Status: AC | PRN

## 2024-06-15 MED ORDER — ALBUTEROL SULFATE HFA 108 (90 BASE) MCG/ACT IN AERS: 1.0000 | INHALATION_SPRAY | Freq: Four times a day (QID) | RESPIRATORY_TRACT | 1 refills | Status: AC | PRN

## 2024-06-15 MED ORDER — POLYETHYLENE GLYCOL 3350 17 G PO PACK: 17.0000 g | PACK | Freq: Every day | ORAL | 0 refills | Status: AC | PRN

## 2024-06-15 MED ORDER — ACETAMINOPHEN 325 MG PO TABS: 650.0000 mg | ORAL_TABLET | Freq: Four times a day (QID) | ORAL | Status: AC | PRN

## 2024-06-15 MED ORDER — PANTOPRAZOLE SODIUM 40 MG PO TBEC: 40.0000 mg | DELAYED_RELEASE_TABLET | Freq: Every day | ORAL | 0 refills | Status: AC

## 2024-06-15 MED ORDER — ASPIRIN 325 MG PO TBEC: 325.0000 mg | DELAYED_RELEASE_TABLET | Freq: Every day | ORAL | 0 refills | Status: AC

## 2024-06-15 NOTE — Progress Notes (Signed)
 Nurse at bedside,patient alert,and oriented times four.Patient did c/o pain to right hip rated a 6 sharp,and constant.Plan of care on going.

## 2024-06-15 NOTE — Progress Notes (Signed)
 Patient refusing to take bath and be repositioned while in bed. Writer educated patient on the importance of turning and reposition with patient stated  I'll do it later

## 2024-06-15 NOTE — Plan of Care (Signed)
" °  Problem: Education: Goal: Knowledge of General Education information will improve Description: Including pain rating scale, medication(s)/side effects and non-pharmacologic comfort measures Outcome: Progressing   Problem: Health Behavior/Discharge Planning: Goal: Ability to manage health-related needs will improve Outcome: Progressing   Problem: Clinical Measurements: Goal: Ability to maintain clinical measurements within normal limits will improve Outcome: Progressing Goal: Will remain free from infection Outcome: Progressing Goal: Diagnostic test results will improve Outcome: Progressing Goal: Respiratory complications will improve Outcome: Progressing Goal: Cardiovascular complication will be avoided Outcome: Progressing   Problem: Activity: Goal: Risk for activity intolerance will decrease Outcome: Progressing   Problem: Nutrition: Goal: Adequate nutrition will be maintained Outcome: Progressing   Problem: Coping: Goal: Level of anxiety will decrease Outcome: Progressing   Problem: Elimination: Goal: Will not experience complications related to bowel motility Outcome: Progressing Goal: Will not experience complications related to urinary retention Outcome: Progressing   Problem: Pain Managment: Goal: General experience of comfort will improve and/or be controlled Outcome: Progressing   Problem: Safety: Goal: Ability to remain free from injury will improve Outcome: Progressing   Problem: Skin Integrity: Goal: Risk for impaired skin integrity will decrease Outcome: Progressing   Problem: Education: Goal: Knowledge of the prescribed therapeutic regimen will improve Outcome: Progressing   Problem: Bowel/Gastric: Goal: Gastrointestinal status for postoperative course will improve Outcome: Progressing   Problem: Cardiac: Goal: Ability to maintain an adequate cardiac output Outcome: Progressing Goal: Will show no evidence of cardiac arrhythmias Outcome:  Progressing   Problem: Nutritional: Goal: Will attain and maintain optimal nutritional status Outcome: Progressing   Problem: Neurological: Goal: Will regain or maintain usual level of consciousness Outcome: Progressing   Problem: Clinical Measurements: Goal: Ability to maintain clinical measurements within normal limits Outcome: Progressing Goal: Postoperative complications will be avoided or minimized Outcome: Progressing   Problem: Respiratory: Goal: Will regain and/or maintain adequate ventilation Outcome: Progressing Goal: Respiratory status will improve Outcome: Progressing   Problem: Skin Integrity: Goal: Demonstrates signs of wound healing without infection Outcome: Progressing   Problem: Urinary Elimination: Goal: Will remain free from infection Outcome: Progressing Goal: Ability to achieve and maintain adequate urine output Outcome: Progressing   Problem: Education: Goal: Verbalization of understanding the information provided (i.e., activity precautions, restrictions, etc) will improve Outcome: Progressing Goal: Individualized Educational Video(s) Outcome: Progressing   Problem: Activity: Goal: Ability to ambulate and perform ADLs will improve Outcome: Progressing   Problem: Clinical Measurements: Goal: Postoperative complications will be avoided or minimized Outcome: Progressing   Problem: Self-Concept: Goal: Ability to maintain and perform role responsibilities to the fullest extent possible will improve Outcome: Progressing   Problem: Pain Management: Goal: Pain level will decrease Outcome: Progressing   "

## 2024-06-15 NOTE — Progress Notes (Signed)
 Patient discharged home with instructions given on medications and follow up visits,patient and family verbalized understanding. Prescriptions sent to Pharmacy of choice documented on AVS. Accompanied by staff to an awaiting vehicle.

## 2024-06-17 DIAGNOSIS — M47816 Spondylosis without myelopathy or radiculopathy, lumbar region: Secondary | ICD-10-CM | POA: Insufficient documentation

## 2024-06-17 DIAGNOSIS — S72001A Fracture of unspecified part of neck of right femur, initial encounter for closed fracture: Secondary | ICD-10-CM | POA: Insufficient documentation

## 2024-06-17 DIAGNOSIS — Z720 Tobacco use: Secondary | ICD-10-CM | POA: Insufficient documentation

## 2024-06-17 DIAGNOSIS — I10 Essential (primary) hypertension: Secondary | ICD-10-CM | POA: Insufficient documentation

## 2024-06-17 DIAGNOSIS — F17210 Nicotine dependence, cigarettes, uncomplicated: Secondary | ICD-10-CM | POA: Insufficient documentation

## 2024-06-17 DIAGNOSIS — G8921 Chronic pain due to trauma: Secondary | ICD-10-CM | POA: Insufficient documentation

## 2024-06-17 DIAGNOSIS — M47817 Spondylosis without myelopathy or radiculopathy, lumbosacral region: Secondary | ICD-10-CM | POA: Insufficient documentation

## 2024-06-24 ENCOUNTER — Encounter: Admitting: Orthopedic Surgery

## 2024-06-24 DIAGNOSIS — S72001D Fracture of unspecified part of neck of right femur, subsequent encounter for closed fracture with routine healing: Secondary | ICD-10-CM
